# Patient Record
Sex: Male | Born: 1970 | Race: White | Hispanic: No | Marital: Single | Smoking: Never smoker
Health system: Southern US, Community
[De-identification: ages and names within clinical notes are randomized; demographics above are authoritative.]

## PROBLEM LIST (undated history)

## (undated) DIAGNOSIS — K219 Gastro-esophageal reflux disease without esophagitis: Secondary | ICD-10-CM

## (undated) DIAGNOSIS — I10 Essential (primary) hypertension: Secondary | ICD-10-CM

## (undated) DIAGNOSIS — R7302 Impaired glucose tolerance (oral): Secondary | ICD-10-CM

## (undated) DIAGNOSIS — N200 Calculus of kidney: Secondary | ICD-10-CM

## (undated) DIAGNOSIS — E669 Obesity, unspecified: Secondary | ICD-10-CM

## (undated) DIAGNOSIS — E785 Hyperlipidemia, unspecified: Secondary | ICD-10-CM

## (undated) DIAGNOSIS — E119 Type 2 diabetes mellitus without complications: Secondary | ICD-10-CM

## (undated) DIAGNOSIS — F419 Anxiety disorder, unspecified: Secondary | ICD-10-CM

## (undated) DIAGNOSIS — G4730 Sleep apnea, unspecified: Secondary | ICD-10-CM

## (undated) HISTORY — PX: HAND SURGERY: SHX662

## (undated) HISTORY — DX: Sleep apnea, unspecified: G47.30

## (undated) HISTORY — DX: Gastro-esophageal reflux disease without esophagitis: K21.9

## (undated) HISTORY — DX: Anxiety disorder, unspecified: F41.9

## (undated) HISTORY — PX: OTHER SURGICAL HISTORY: SHX169

## (undated) HISTORY — DX: Calculus of kidney: N20.0

## (undated) HISTORY — DX: Type 2 diabetes mellitus without complications: E11.9

## (undated) HISTORY — DX: Impaired glucose tolerance (oral): R73.02

## (undated) HISTORY — DX: Hyperlipidemia, unspecified: E78.5

## (undated) HISTORY — DX: Obesity, unspecified: E66.9

---

## 2002-09-24 ENCOUNTER — Emergency Department (HOSPITAL_COMMUNITY): Admission: EM | Admit: 2002-09-24 | Discharge: 2002-09-24 | Payer: Self-pay | Admitting: Emergency Medicine

## 2004-07-28 ENCOUNTER — Emergency Department (HOSPITAL_COMMUNITY): Admission: EM | Admit: 2004-07-28 | Discharge: 2004-07-28 | Payer: Self-pay | Admitting: Family Medicine

## 2004-08-04 ENCOUNTER — Emergency Department (HOSPITAL_COMMUNITY): Admission: EM | Admit: 2004-08-04 | Discharge: 2004-08-04 | Payer: Self-pay | Admitting: Emergency Medicine

## 2004-10-10 ENCOUNTER — Emergency Department (HOSPITAL_COMMUNITY): Admission: EM | Admit: 2004-10-10 | Discharge: 2004-10-10 | Payer: Self-pay | Admitting: Family Medicine

## 2005-10-29 ENCOUNTER — Emergency Department (HOSPITAL_COMMUNITY): Admission: EM | Admit: 2005-10-29 | Discharge: 2005-10-29 | Payer: Self-pay | Admitting: Emergency Medicine

## 2006-06-07 ENCOUNTER — Emergency Department (HOSPITAL_COMMUNITY): Admission: EM | Admit: 2006-06-07 | Discharge: 2006-06-07 | Payer: Self-pay | Admitting: Emergency Medicine

## 2006-11-22 ENCOUNTER — Emergency Department (HOSPITAL_COMMUNITY): Admission: EM | Admit: 2006-11-22 | Discharge: 2006-11-22 | Payer: Self-pay | Admitting: Emergency Medicine

## 2011-04-06 ENCOUNTER — Emergency Department (HOSPITAL_BASED_OUTPATIENT_CLINIC_OR_DEPARTMENT_OTHER)
Admission: EM | Admit: 2011-04-06 | Discharge: 2011-04-06 | Disposition: A | Discharge status: Home / Self Care | Payer: No Typology Code available for payment source | Attending: Emergency Medicine | Admitting: Emergency Medicine

## 2011-04-06 ENCOUNTER — Encounter: Payer: Self-pay | Admitting: Emergency Medicine

## 2011-04-06 DIAGNOSIS — E669 Obesity, unspecified: Secondary | ICD-10-CM | POA: Insufficient documentation

## 2011-04-06 DIAGNOSIS — T148XXA Other injury of unspecified body region, initial encounter: Secondary | ICD-10-CM

## 2011-04-06 DIAGNOSIS — S0003XA Contusion of scalp, initial encounter: Secondary | ICD-10-CM | POA: Insufficient documentation

## 2011-04-06 DIAGNOSIS — S1093XA Contusion of unspecified part of neck, initial encounter: Secondary | ICD-10-CM | POA: Insufficient documentation

## 2011-04-06 DIAGNOSIS — S0083XA Contusion of other part of head, initial encounter: Secondary | ICD-10-CM

## 2011-04-06 DIAGNOSIS — Y92410 Unspecified street and highway as the place of occurrence of the external cause: Secondary | ICD-10-CM | POA: Insufficient documentation

## 2011-04-06 MED ORDER — ONDANSETRON HCL 8 MG PO TABS: 4.0000 mg | ORAL_TABLET | Freq: Once | ORAL | Status: DC

## 2011-04-06 MED ORDER — ONDANSETRON 4 MG PO TBDP
4.0000 mg | ORAL_TABLET | Freq: Once | ORAL | Status: AC
  Administered 2011-04-06: 4 mg via ORAL

## 2011-04-06 MED ORDER — IBUPROFEN 800 MG PO TABS
800.0000 mg | ORAL_TABLET | Freq: Once | ORAL | Status: AC
  Administered 2011-04-06: 800 mg via ORAL
  Filled 2011-04-06: qty 1

## 2011-04-06 MED ORDER — CYCLOBENZAPRINE HCL 10 MG PO TABS: 10.0000 mg | ORAL_TABLET | Freq: Two times a day (BID) | ORAL | Status: AC | PRN

## 2011-04-06 MED ORDER — OXYCODONE-ACETAMINOPHEN 5-325 MG PO TABS
2.0000 | ORAL_TABLET | Freq: Once | ORAL | Status: AC
  Administered 2011-04-06: 2 via ORAL
  Filled 2011-04-06: qty 2

## 2011-04-06 MED ORDER — ONDANSETRON 4 MG PO TBDP
ORAL_TABLET | ORAL | Status: AC
  Administered 2011-04-06: 4 mg via ORAL
  Filled 2011-04-06: qty 1

## 2011-04-06 MED ORDER — NAPROXEN 375 MG PO TABS: 375.0000 mg | ORAL_TABLET | Freq: Two times a day (BID) | ORAL | Status: AC

## 2011-04-06 MED ORDER — DIAZEPAM 5 MG PO TABS
5.0000 mg | ORAL_TABLET | Freq: Once | ORAL | Status: AC
  Administered 2011-04-06: 5 mg via ORAL
  Filled 2011-04-06: qty 1

## 2011-04-06 NOTE — ED Notes (Signed)
MD at bedside. 
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
## 2012-10-02 ENCOUNTER — Encounter (HOSPITAL_COMMUNITY): Payer: Self-pay | Admitting: Emergency Medicine

## 2012-10-02 ENCOUNTER — Emergency Department (HOSPITAL_COMMUNITY)
Admission: EM | Admit: 2012-10-02 | Discharge: 2012-10-02 | Disposition: A | Discharge status: Home / Self Care | Payer: No Typology Code available for payment source | Attending: Emergency Medicine | Admitting: Emergency Medicine

## 2012-10-02 DIAGNOSIS — I10 Essential (primary) hypertension: Secondary | ICD-10-CM

## 2012-10-02 DIAGNOSIS — S4980XA Other specified injuries of shoulder and upper arm, unspecified arm, initial encounter: Secondary | ICD-10-CM | POA: Insufficient documentation

## 2012-10-02 DIAGNOSIS — Z79899 Other long term (current) drug therapy: Secondary | ICD-10-CM | POA: Insufficient documentation

## 2012-10-02 DIAGNOSIS — S46909A Unspecified injury of unspecified muscle, fascia and tendon at shoulder and upper arm level, unspecified arm, initial encounter: Secondary | ICD-10-CM | POA: Insufficient documentation

## 2012-10-02 DIAGNOSIS — S0993XA Unspecified injury of face, initial encounter: Secondary | ICD-10-CM | POA: Insufficient documentation

## 2012-10-02 DIAGNOSIS — Y92410 Unspecified street and highway as the place of occurrence of the external cause: Secondary | ICD-10-CM | POA: Insufficient documentation

## 2012-10-02 DIAGNOSIS — Y9389 Activity, other specified: Secondary | ICD-10-CM | POA: Insufficient documentation

## 2012-10-02 DIAGNOSIS — S199XXA Unspecified injury of neck, initial encounter: Secondary | ICD-10-CM | POA: Insufficient documentation

## 2012-10-02 MED ORDER — NAPROXEN 500 MG PO TABS: ORAL_TABLET | ORAL | Status: DC

## 2012-10-02 MED ORDER — CYCLOBENZAPRINE HCL 10 MG PO TABS: ORAL_TABLET | ORAL | Status: DC

## 2012-10-02 MED ORDER — IBUPROFEN 800 MG PO TABS
800.0000 mg | ORAL_TABLET | Freq: Once | ORAL | Status: AC
  Administered 2012-10-02: 800 mg via ORAL
  Filled 2012-10-02: qty 1
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
## 2012-10-03 NOTE — ED Provider Notes (Signed)
Medical screening examination/treatment/procedure(s) were performed by non-physician practitioner and as supervising physician I was immediately available for consultation/collaboration.   Aylin Rhoads J. Kadence Mimbs, MD 10/03/12 0019 

## 2014-01-31 ENCOUNTER — Emergency Department (HOSPITAL_COMMUNITY)
Admission: EM | Admit: 2014-01-31 | Discharge: 2014-01-31 | Disposition: A | Discharge status: Home / Self Care | Payer: 59 | Attending: Emergency Medicine | Admitting: Emergency Medicine

## 2014-01-31 ENCOUNTER — Emergency Department (HOSPITAL_COMMUNITY): Payer: 59

## 2014-01-31 ENCOUNTER — Encounter (HOSPITAL_COMMUNITY): Payer: Self-pay | Admitting: Emergency Medicine

## 2014-01-31 DIAGNOSIS — R1013 Epigastric pain: Secondary | ICD-10-CM | POA: Diagnosis present

## 2014-01-31 DIAGNOSIS — K859 Acute pancreatitis without necrosis or infection, unspecified: Secondary | ICD-10-CM | POA: Insufficient documentation

## 2014-01-31 DIAGNOSIS — R0602 Shortness of breath: Secondary | ICD-10-CM | POA: Diagnosis not present

## 2014-01-31 DIAGNOSIS — E669 Obesity, unspecified: Secondary | ICD-10-CM | POA: Insufficient documentation

## 2014-01-31 DIAGNOSIS — Z79899 Other long term (current) drug therapy: Secondary | ICD-10-CM | POA: Insufficient documentation

## 2014-01-31 DIAGNOSIS — M549 Dorsalgia, unspecified: Secondary | ICD-10-CM | POA: Insufficient documentation

## 2014-01-31 LAB — CBC WITH DIFFERENTIAL/PLATELET
BASOS ABS: 0.1 10*3/uL (ref 0.0–0.1)
Basophils Relative: 0 % (ref 0–1)
EOS PCT: 2 % (ref 0–5)
Eosinophils Absolute: 0.3 10*3/uL (ref 0.0–0.7)
HEMATOCRIT: 42.7 % (ref 39.0–52.0)
Hemoglobin: 14.2 g/dL (ref 13.0–17.0)
LYMPHS ABS: 2.5 10*3/uL (ref 0.7–4.0)
LYMPHS PCT: 17 % (ref 12–46)
MCH: 30 pg (ref 26.0–34.0)
MCHC: 33.3 g/dL (ref 30.0–36.0)
MCV: 90.1 fL (ref 78.0–100.0)
MONO ABS: 1 10*3/uL (ref 0.1–1.0)
Monocytes Relative: 7 % (ref 3–12)
NEUTROS ABS: 10.7 10*3/uL — AB (ref 1.7–7.7)
Neutrophils Relative %: 74 % (ref 43–77)
Platelets: 265 10*3/uL (ref 150–400)
RBC: 4.74 MIL/uL (ref 4.22–5.81)
RDW: 13.7 % (ref 11.5–15.5)
WBC: 14.5 10*3/uL — AB (ref 4.0–10.5)

## 2014-01-31 LAB — LIPASE, BLOOD: Lipase: 124 U/L — ABNORMAL HIGH (ref 11–59)

## 2014-01-31 LAB — COMPREHENSIVE METABOLIC PANEL
ALT: 13 U/L (ref 0–53)
AST: 14 U/L (ref 0–37)
Albumin: 3.6 g/dL (ref 3.5–5.2)
Alkaline Phosphatase: 60 U/L (ref 39–117)
Anion gap: 14 (ref 5–15)
BILIRUBIN TOTAL: 0.4 mg/dL (ref 0.3–1.2)
BUN: 13 mg/dL (ref 6–23)
CALCIUM: 9.2 mg/dL (ref 8.4–10.5)
CHLORIDE: 100 meq/L (ref 96–112)
CO2: 25 meq/L (ref 19–32)
CREATININE: 0.79 mg/dL (ref 0.50–1.35)
GLUCOSE: 174 mg/dL — AB (ref 70–99)
Potassium: 4.6 mEq/L (ref 3.7–5.3)
SODIUM: 139 meq/L (ref 137–147)
Total Protein: 7.7 g/dL (ref 6.0–8.3)

## 2014-01-31 LAB — TROPONIN I

## 2014-01-31 MED ORDER — IOHEXOL 300 MG/ML  SOLN
50.0000 mL | Freq: Once | INTRAMUSCULAR | Status: AC | PRN
  Administered 2014-01-31: 50 mL via ORAL

## 2014-01-31 MED ORDER — ONDANSETRON 8 MG PO TBDP: 8.0000 mg | ORAL_TABLET | Freq: Three times a day (TID) | ORAL | Status: DC | PRN

## 2014-01-31 MED ORDER — GI COCKTAIL ~~LOC~~
30.0000 mL | Freq: Once | ORAL | Status: AC
  Administered 2014-01-31: 30 mL via ORAL
  Filled 2014-01-31: qty 30

## 2014-01-31 MED ORDER — MORPHINE SULFATE 4 MG/ML IJ SOLN
4.0000 mg | Freq: Once | INTRAMUSCULAR | Status: AC
  Administered 2014-01-31: 4 mg via INTRAVENOUS
  Filled 2014-01-31: qty 1

## 2014-01-31 MED ORDER — HYDROCODONE-ACETAMINOPHEN 5-325 MG PO TABS: 1.0000 | ORAL_TABLET | Freq: Four times a day (QID) | ORAL | Status: DC | PRN

## 2014-01-31 MED ORDER — IOHEXOL 300 MG/ML  SOLN
100.0000 mL | Freq: Once | INTRAMUSCULAR | Status: AC | PRN
  Administered 2014-01-31: 100 mL via INTRAVENOUS

## 2014-01-31 MED ORDER — SODIUM CHLORIDE 0.9 % IV BOLUS (SEPSIS)
1000.0000 mL | Freq: Once | INTRAVENOUS | Status: AC
  Administered 2014-01-31: 1000 mL via INTRAVENOUS

## 2014-01-31 MED ORDER — ONDANSETRON HCL 4 MG/2ML IJ SOLN
4.0000 mg | Freq: Once | INTRAMUSCULAR | Status: AC
  Administered 2014-01-31: 4 mg via INTRAVENOUS
  Filled 2014-01-31: qty 2

## 2014-01-31 MED ORDER — FAMOTIDINE 20 MG PO TABS
20.0000 mg | ORAL_TABLET | Freq: Once | ORAL | Status: AC
  Administered 2014-01-31: 20 mg via ORAL
  Filled 2014-01-31: qty 1

## 2014-01-31 NOTE — ED Provider Notes (Signed)
Signed out by Dr Dina Rich at 0800 that pt w suspected mild pancreatitis, ct pending, if ct c/w pancreatitis, d/c to home.  Recheck pt, feels much improved. Pain controlled. No nv. Tolerating po fluids.   Discussed ct results w pt, including pancreatitis, and incidental note of liver/renal lesions and need for follow up imaging.   Pt currently appears stable for d/c.     Mirna Mires, MD 01/31/14 [phone]

## 2014-01-31 NOTE — ED Notes (Signed)
Pt escorted to discharge window. Pt verbalized understanding discharge instructions. In no acute distress.  

## 2014-01-31 NOTE — ED Notes (Signed)
Patient transported to CT 

## 2014-01-31 NOTE — ED Notes (Signed)
Patient c/o upper abd pain, with pain to back and difficulty breathing. Patient states symptoms started yesterday at work. 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 Patient is able to speak in complete sentences without difficulty. Patient describes pain as a sensation of fullness.

## 2014-01-31 NOTE — ED Provider Notes (Signed)
CSN: [id number]     Arrival date & time 01/31/14  0436 History   First MD Initiated Contact with Patient 01/31/14 0450     Chief Complaint  Patient presents with  . Abdominal Pain  . Back Pain     (Consider location/radiation/quality/duration/timing/severity/associated sxs/prior Treatment) HPI  This a 43 year old male who presents with epigastric pain. Patient reports onset of pain yesterday. He reports the sharp and radiates to the back. He states that it takes his breath away. It is not associated with food. Patient states that when he "passes gas" it may get a little bit better. Currently his pain is 7/10. He states that he feels very full. He's never had anything on this in the past. He denies any nausea, vomiting, diarrhea. Patient denies any chest pain. He does report shortness of breath. No fevers or cough.  History reviewed. No pertinent past medical history. Past Surgical History  Procedure Laterality Date  . Hand surgery     No family history on file. History  Substance Use Topics  . Smoking status: Never Smoker   . Smokeless tobacco: Not on file  . Alcohol Use: Yes     Comment: rarely    Review of Systems  Constitutional: Negative.  Negative for fever.  Respiratory: Positive for shortness of breath. Negative for cough and chest tightness.   Cardiovascular: Negative.  Negative for chest pain and leg swelling.  Gastrointestinal: Positive for abdominal pain. Negative for nausea, vomiting and diarrhea.  Genitourinary: Negative.  Negative for dysuria.  Musculoskeletal: Negative for back pain.  Skin: Negative for rash.  Neurological: Negative for headaches.  All other systems reviewed and are negative.     Allergies  Review of patient's allergies indicates no known allergies.  Home Medications   Prior to Admission medications   Medication Sig Start Date End Date Taking? Authorizing Provider  acetaminophen (TYLENOL) 325 MG tablet Take 650 mg by mouth every 6  (six) hours as needed for mild pain.   Yes Historical Provider, MD  ibuprofen (ADVIL,MOTRIN) 200 MG tablet Take 600 mg by mouth every 6 (six) hours as needed for moderate pain.   Yes Historical Provider, MD   BP 151/98  Temp(Src) 98 F (36.7 C) (Oral)  Resp 18  Ht 5\' 7"  (1.702 m)  Wt 290 lb (131.543 kg)  BMI 45.41 kg/m2  SpO2 100% Physical Exam  Nursing note and vitals reviewed. Constitutional: He is oriented to person, place, and time. He appears well-developed and well-nourished.  Obese  HENT:  Head: Normocephalic and atraumatic.  Mouth/Throat: Oropharynx is clear and moist.  Eyes: Pupils are equal, round, and reactive to light.  Cardiovascular: Normal rate, regular rhythm and normal heart sounds.   No murmur heard. Pulmonary/Chest: Effort normal and breath sounds normal. No respiratory distress. He has no wheezes.  Abdominal: Soft. Bowel sounds are normal. There is tenderness. There is no rebound.  Tenderness to palpation over the epigastrium without rebound or guarding  Musculoskeletal: He exhibits no edema.  Neurological: He is alert and oriented to person, place, and time.  Skin: Skin is warm and dry.  Psychiatric: He has a normal mood and affect.    ED Course  Procedures (including critical care time) Labs Review Labs Reviewed  CBC WITH DIFFERENTIAL - Abnormal; Notable for the following:    WBC 14.5 (*)    Neutro Abs 10.7 (*)    All other components within normal limits  COMPREHENSIVE METABOLIC PANEL - Abnormal; Notable for the following:  Glucose, Bld 174 (*)    All other components within normal limits  LIPASE, BLOOD - Abnormal; Notable for the following:    Lipase 124 (*)    All other components within normal limits  TROPONIN I    Imaging Review Dg Chest 2 View  01/31/2014   CLINICAL DATA:  Upper abdominal pain and back pain. Difficulty breathing.  EXAM: CHEST  2 VIEW  COMPARISON:  None.  FINDINGS: The lungs are hypoexpanded but appear grossly clear. There  is no evidence of focal opacification, pleural effusion or pneumothorax.  The heart is normal in size; the mediastinal contour is within normal limits. No acute osseous abnormalities are seen.  IMPRESSION: Lungs hypoexpanded but grossly clear.   Electronically Signed   By: Garald Balding M.D.   On: 01/31/2014 06:39     EKG Interpretation   Date/Time:  Sunday January 31 2014 04:49:52 EDT Ventricular Rate:  98 PR Interval:  123 QRS Duration: 104 QT Interval:  341 QTC Calculation: 435 R Axis:   85 Text Interpretation:  Sinus rhythm No prior for comparison Confirmed by  HORTON  MD, COURTNEY ([id number]) on 01/31/2014 5:28:31 AM      MDM   Final diagnoses:  None    Patient presents with abdominal pain. Nontoxic on exam. Vital signs are reassuring. Tenderness to palpation to the epigastrium. History suggestive of GERD versus pancreatitis. Denies any alcohol use. Other considerations also include ACS. Screening EKG reassuring and troponin negative. Patient given GI cocktail as well as Pepcid with only minimal improvement of his pain. Labwork notable for mildly elevated lipase at 124 and leukocytosis to 14. Patient continues to be tender on exam. Will give additional pain medication and obtain CT scan.    Merryl Hacker, MD 01/31/14 [phone]

## 2014-01-31 NOTE — Discharge Instructions (Signed)
Rest. Drink plenty of fluids. Clear liquid diet for the next day, then advance as tolerated. You may take hydrocodone as need for pain. No driving when taking hydrocodone. Also, do not take tylenol or acetaminophen containing medication when taking hydrocodone. You may take zofran as need for nausea.   Your ct scan was read as follows:  IMPRESSION: Mild pancreatitis. No complicating features identified.  1.2 x 1.5 cm indeterminate right hepatic lesion and too small to characterize left renal lesions. These are statistically benign but recommend MRI with and without contrast for further evaluation. Non-emergent MRI should be deferred until patient has been discharged for the acute illness, and can optimally cooperate with positioning and breath-holding instructions.  Punctate nonobstructing right renal calculi.    It is the pancreatitis that is responsible for today's pain.  For incidental note of small liver/kidney lesions, follow up with primary care doctor for outpatient imaging/MR scan in the next couple months - discuss above findings with primary care doctor and have them arrange the appropriate, recommended, follow up imaging.   You were given pain medication in the ER - no driving for the next 4 hours.     Acute Pancreatitis Acute pancreatitis is a disease in which the pancreas becomes suddenly inflamed. The pancreas is a large gland located behind your stomach. The pancreas produces enzymes that help digest food. The pancreas also releases the hormones glucagon and insulin that help regulate blood sugar. Damage to the pancreas occurs when the digestive enzymes from the pancreas are activated and begin attacking the pancreas before being released into the intestine. Most acute attacks last a couple of days and can cause serious complications. Some people become dehydrated and develop low blood pressure. In severe cases, bleeding into the pancreas can lead to shock and can be  life-threatening. The lungs, heart, and kidneys may fail. CAUSES  Pancreatitis can happen to anyone. In some cases, the cause is unknown. Most cases are caused by:  Alcohol abuse.  Gallstones. Other less common causes are:  Certain medicines.  Exposure to certain chemicals.  Infection.  Damage caused by an accident (trauma).  Abdominal surgery. SYMPTOMS   Pain in the upper abdomen that may radiate to the back.  Tenderness and swelling of the abdomen.  Nausea and vomiting. DIAGNOSIS  Your caregiver will perform a physical exam. Blood and stool tests may be done to confirm the diagnosis. Imaging tests may also be done, such as X-rays, CT scans, or an ultrasound of the abdomen. TREATMENT  Treatment usually requires a stay in the hospital. Treatment may include:  Pain medicine.  Fluid replacement through an intravenous line (IV).  Placing a tube in the stomach to remove stomach contents and control vomiting.  Not eating for 3 or 4 days. This gives your pancreas a rest, because enzymes are not being produced that can cause further damage.  Antibiotic medicines if your condition is caused by an infection.  Surgery of the pancreas or gallbladder. HOME CARE INSTRUCTIONS   Follow the diet advised by your caregiver. This may involve avoiding alcohol and decreasing the amount of fat in your diet.  Eat smaller, more frequent meals. This reduces the amount of digestive juices the pancreas produces.  Drink enough fluids to keep your urine clear or pale yellow.  Only take over-the-counter or prescription medicines as directed by your caregiver.  Avoid drinking alcohol if it caused your condition.  Do not smoke.  Get plenty of rest.  Check your blood sugar at  home as directed by your caregiver.  Keep all follow-up appointments as directed by your caregiver. SEEK MEDICAL CARE IF:   You do not recover as quickly as expected.  You develop new or worsening symptoms.  You  have persistent pain, weakness, or nausea.  You recover and then have another episode of pain. SEEK IMMEDIATE MEDICAL CARE IF:   You are unable to eat or keep fluids down.  Your pain becomes severe.  You have a fever or persistent symptoms for more than 2 to 3 days.  You have a fever and your symptoms suddenly get worse.  Your skin or the white part of your eyes turn yellow (jaundice).  You develop vomiting.  You feel dizzy, or you faint.  Your blood sugar is high (over 300 mg/dL). MAKE SURE YOU:   Understand these instructions.  Will watch your condition.  Will get help right away if you are not doing well or get worse. Document Released: 05/07/2005 Document Revised: 11/06/2011 Document Reviewed: 08/16/2011 [facility] Patient Information 2015 [place], [place]. This information is not intended to replace advice given to you by your health care provider. Make sure you discuss any questions you have with your health care provider.

## 2014-01-31 NOTE — ED Notes (Addendum)
Pt to CT  Pt alert and oriented x4. Respirations even and unlabored, bilateral symmetrical rise and fall of chest. Skin warm and dry. In no acute distress. Denies needs.

## 2014-02-03 ENCOUNTER — Other Ambulatory Visit: Payer: Self-pay | Admitting: Gastroenterology

## 2014-02-03 DIAGNOSIS — K858 Other acute pancreatitis without necrosis or infection: Secondary | ICD-10-CM

## 2014-02-12 ENCOUNTER — Ambulatory Visit
Admission: RE | Admit: 2014-02-12 | Discharge: 2014-02-12 | Disposition: A | Discharge status: Home / Self Care | Payer: 59 | Source: Ambulatory Visit | Attending: Gastroenterology | Admitting: Gastroenterology

## 2014-02-12 DIAGNOSIS — K858 Other acute pancreatitis without necrosis or infection: Secondary | ICD-10-CM

## 2014-03-27 ENCOUNTER — Ambulatory Visit (INDEPENDENT_AMBULATORY_CARE_PROVIDER_SITE_OTHER): Payer: 59 | Admitting: Emergency Medicine

## 2014-03-27 VITALS — BP 130/90 | HR 85 | Temp 98.9°F | Resp 18 | Ht 68.25 in | Wt 296.0 lb

## 2014-03-27 DIAGNOSIS — J0100 Acute maxillary sinusitis, unspecified: Secondary | ICD-10-CM

## 2014-03-27 DIAGNOSIS — J209 Acute bronchitis, unspecified: Secondary | ICD-10-CM

## 2014-03-27 MED ORDER — PROMETHAZINE-CODEINE 6.25-10 MG/5ML PO SYRP: 5.0000 mL | ORAL_SOLUTION | Freq: Four times a day (QID) | ORAL | Status: DC | PRN

## 2014-03-27 MED ORDER — PSEUDOEPHEDRINE-GUAIFENESIN ER 60-600 MG PO TB12: 1.0000 | ORAL_TABLET | Freq: Two times a day (BID) | ORAL | Status: DC

## 2014-03-27 MED ORDER — AMOXICILLIN-POT CLAVULANATE 875-125 MG PO TABS: 1.0000 | ORAL_TABLET | Freq: Two times a day (BID) | ORAL | Status: DC

## 2014-03-27 NOTE — Progress Notes (Signed)
[facility] [address], [place] [address] [phone]  Date:  03/27/2014   Name:  Benjamin Sharp   DOB:  04-Mar-1971   MRN:  [id number]  PCP:  Geoffery Lyons, MD    Chief Complaint: Cough; Headache; and Fatigue   History of Present Illness:  Benjamin Sharp is a 43 y.o. very pleasant male patient who presents with the following:  Ill for a week with nasal congestion and purulent nasal discharge and post nasal drip. Sore throat Cough productive of purulent sputum.  Some exertional shortness of breath.  No wheezing No nausea or vomiting.  No stool change or rash. No fever or chills. No improvement with over the counter medications or other home remedies.  Denies other complaint or health concern today. Moderate malaise and aches   There are no active problems to display for this patient.   History reviewed. No pertinent past medical history.  Past Surgical History  Procedure Laterality Date  . Hand surgery      History  Substance Use Topics  . Smoking status: Never Smoker   . Smokeless tobacco: Not on file  . Alcohol Use: Yes     Comment: rarely    Family History  Problem Relation Age of Onset  . Diabetes Mother   . Diabetes Father     No Known Allergies  Medication list has been reviewed and updated.  Current Outpatient Prescriptions on File Prior to Visit  Medication Sig Dispense Refill  . acetaminophen (TYLENOL) 325 MG tablet Take 650 mg by mouth every 6 (six) hours as needed for mild pain.    Marland Kitchen ibuprofen (ADVIL,MOTRIN) 200 MG tablet Take 600 mg by mouth every 6 (six) hours as needed for moderate pain.    Marland Kitchen HYDROcodone-acetaminophen (NORCO/VICODIN) 5-325 MG per tablet Take 1-2 tablets by mouth every 6 (six) hours as needed for moderate pain. 20 tablet 0  . ondansetron (ZOFRAN ODT) 8 MG disintegrating tablet Take 1 tablet (8 mg total) by mouth every 8 (eight) hours as needed for nausea or vomiting. 10 tablet 0   No current  facility-administered medications on file prior to visit.    Review of Systems:  As per HPI, otherwise negative.    Physical Examination: Filed Vitals:   03/27/14 0905  BP: 130/90  Pulse: 85  Temp: 98.9 F (37.2 C)  Resp: 18   Filed Vitals:   03/27/14 0905  Height: 5' 8.25" (1.734 m)  Weight: 296 lb (134.265 kg)   Body mass index is 44.65 kg/(m^2). Ideal Body Weight: Weight in (lb) to have BMI = 25: 165.3  GEN: obese, NAD, Non-toxic, A & O x 3 HEENT: Atraumatic, Normocephalic. Neck supple. No masses, No LAD. Ears and Nose: No external deformity. CV: RRR, No M/G/R. No JVD. No thrill. No extra heart sounds. PULM: CTA B, no wheezes, crackles, rhonchi. No retractions. No resp. distress. No accessory muscle use. ABD: S, NT, ND, +BS. No rebound. No HSM. EXTR: No c/c/e NEURO Normal gait.  PSYCH: Normally interactive. Conversant. Not depressed or anxious appearing.  Calm demeanor.    Assessment and Plan: Sinusitis Bronchitis augmentin mucinex d prometh c cod  Signed,  Ellison Carwin, MD

## 2014-03-27 NOTE — Patient Instructions (Signed)

## 2014-04-15 ENCOUNTER — Emergency Department (HOSPITAL_COMMUNITY): Payer: 59

## 2014-04-15 ENCOUNTER — Emergency Department (HOSPITAL_COMMUNITY)
Admission: EM | Admit: 2014-04-15 | Discharge: 2014-04-15 | Disposition: A | Discharge status: Home / Self Care | Payer: 59 | Attending: Emergency Medicine | Admitting: Emergency Medicine

## 2014-04-15 ENCOUNTER — Encounter (HOSPITAL_COMMUNITY): Payer: Self-pay | Admitting: *Deleted

## 2014-04-15 DIAGNOSIS — N390 Urinary tract infection, site not specified: Secondary | ICD-10-CM | POA: Insufficient documentation

## 2014-04-15 DIAGNOSIS — R319 Hematuria, unspecified: Secondary | ICD-10-CM

## 2014-04-15 DIAGNOSIS — R109 Unspecified abdominal pain: Secondary | ICD-10-CM

## 2014-04-15 DIAGNOSIS — I10 Essential (primary) hypertension: Secondary | ICD-10-CM | POA: Diagnosis not present

## 2014-04-15 DIAGNOSIS — Z79899 Other long term (current) drug therapy: Secondary | ICD-10-CM | POA: Diagnosis not present

## 2014-04-15 DIAGNOSIS — R000 Tachycardia, unspecified: Secondary | ICD-10-CM | POA: Diagnosis not present

## 2014-04-15 HISTORY — DX: Essential (primary) hypertension: I10

## 2014-04-15 LAB — BASIC METABOLIC PANEL
Anion gap: 16 — ABNORMAL HIGH (ref 5–15)
BUN: 14 mg/dL (ref 6–23)
CO2: 24 meq/L (ref 19–32)
CREATININE: 0.86 mg/dL (ref 0.50–1.35)
Calcium: 8.9 mg/dL (ref 8.4–10.5)
Chloride: 94 mEq/L — ABNORMAL LOW (ref 96–112)
GFR calc Af Amer: >90 mL/min (ref >90)
GFR calc non Af Amer: >90 mL/min (ref >90)
Glucose, Bld: 294 mg/dL — ABNORMAL HIGH (ref 70–99)
Potassium: 4.2 mEq/L (ref 3.7–5.3)
Sodium: 134 mEq/L — ABNORMAL LOW (ref 137–147)

## 2014-04-15 LAB — CBC WITH DIFFERENTIAL/PLATELET
BASOS ABS: 0 10*3/uL (ref 0.0–0.1)
BASOS PCT: 0 % (ref 0–1)
EOS PCT: 2 % (ref 0–5)
Eosinophils Absolute: 0.2 10*3/uL (ref 0.0–0.7)
HCT: 40.4 % (ref 39.0–52.0)
Hemoglobin: 13.3 g/dL (ref 13.0–17.0)
LYMPHS PCT: 19 % (ref 12–46)
Lymphs Abs: 2.8 10*3/uL (ref 0.7–4.0)
MCH: 29.1 pg (ref 26.0–34.0)
MCHC: 32.9 g/dL (ref 30.0–36.0)
MCV: 88.4 fL (ref 78.0–100.0)
MONO ABS: 0.9 10*3/uL (ref 0.1–1.0)
Monocytes Relative: 7 % (ref 3–12)
Neutro Abs: 10.3 10*3/uL — ABNORMAL HIGH (ref 1.7–7.7)
Neutrophils Relative %: 72 % (ref 43–77)
Platelets: 291 10*3/uL (ref 150–400)
RBC: 4.57 MIL/uL (ref 4.22–5.81)
RDW: 13.8 % (ref 11.5–15.5)
WBC: 14.2 10*3/uL — ABNORMAL HIGH (ref 4.0–10.5)

## 2014-04-15 LAB — URINALYSIS, ROUTINE W REFLEX MICROSCOPIC
BILIRUBIN URINE: NEGATIVE
Ketones, ur: NEGATIVE mg/dL
Nitrite: NEGATIVE
PH: 5.5 (ref 5.0–8.0)
Protein, ur: 30 mg/dL — AB
SPECIFIC GRAVITY, URINE: 1.033 — AB (ref 1.005–1.030)
Urobilinogen, UA: 0.2 mg/dL (ref 0.0–1.0)

## 2014-04-15 LAB — URINE MICROSCOPIC-ADD ON

## 2014-04-15 MED ORDER — SODIUM CHLORIDE 0.9 % IV BOLUS (SEPSIS)
1000.0000 mL | Freq: Once | INTRAVENOUS | Status: AC
  Administered 2014-04-15: 1000 mL via INTRAVENOUS

## 2014-04-15 MED ORDER — DEXTROSE 5 % IV SOLN
1.0000 g | Freq: Once | INTRAVENOUS | Status: AC
  Administered 2014-04-15: 1 g via INTRAVENOUS
  Filled 2014-04-15: qty 10

## 2014-04-15 MED ORDER — CEPHALEXIN 500 MG PO CAPS: 500.0000 mg | ORAL_CAPSULE | Freq: Four times a day (QID) | ORAL | Status: DC

## 2014-04-15 NOTE — ED Notes (Signed)
The pt has had bright red blood since 1600 today.  The blood is bright red with clots.  No lpain.  He feels like he has to go more than he does..  No previous history

## 2014-04-15 NOTE — ED Provider Notes (Signed)
CSN: [id number]     Arrival date & time 04/15/14  1957 History   First MD Initiated Contact with Patient 04/15/14 2002     Chief Complaint  Patient presents with  . Hematuria     The history is provided by the patient.   Mr. Bomberger presents for evaluation of hematuria.  He developed some bright red blood in his urine starting at 4 PM today. He passed a small clot about the size of P at home. He reports urgency and frequency. He has mild discomfort with urination. He denies any abdominal pain but does have nausea. He had a small twinge in his right side that is now gone. Symptoms are moderate and intermittent and constant. He has no history of similar prior symptoms. He denies any history of trauma. He denies fevers, vomiting. He is not on any blood thinners.    Past Medical History  Diagnosis Date  . Hypertension    Past Surgical History  Procedure Laterality Date  . Hand surgery     Family History  Problem Relation Age of Onset  . Diabetes Mother   . Diabetes Father    History  Substance Use Topics  . Smoking status: Never Smoker   . Smokeless tobacco: Not on file  . Alcohol Use: Yes     Comment: rarely    Review of Systems  All other systems reviewed and are negative.     Allergies  Review of patient's allergies indicates no known allergies.  Home Medications   Prior to Admission medications   Medication Sig Start Date End Date Taking? Authorizing Provider  acetaminophen (TYLENOL) 325 MG tablet Take 650 mg by mouth every 6 (six) hours as needed for mild pain.    Historical Provider, MD  amoxicillin-clavulanate (AUGMENTIN) 875-125 MG per tablet Take 1 tablet by mouth 2 (two) times daily. 03/27/14   Roselee Culver, MD  HYDROcodone-acetaminophen (NORCO/VICODIN) 5-325 MG per tablet Take 1-2 tablets by mouth every 6 (six) hours as needed for moderate pain. 01/31/14   Mirna Mires, MD  ibuprofen (ADVIL,MOTRIN) 200 MG tablet Take 600 mg by mouth every 6 (six) hours as  needed for moderate pain.    Historical Provider, MD  ondansetron (ZOFRAN ODT) 8 MG disintegrating tablet Take 1 tablet (8 mg total) by mouth every 8 (eight) hours as needed for nausea or vomiting. 01/31/14   Mirna Mires, MD  promethazine-codeine [facility] WITH CODEINE) 6.25-10 MG/5ML syrup Take 5-10 mLs by mouth every 6 (six) hours as needed. 03/27/14   Roselee Culver, MD  pseudoephedrine-guaifenesin (MUCINEX D) 60-600 MG per tablet Take 1 tablet by mouth every 12 (twelve) hours. 03/27/14 03/27/15  Roselee Culver, MD   BP 156/101 mmHg  Pulse 120  Temp(Src) 99.2 F (37.3 C)  Resp 18  Ht 5\' 7"  (1.702 m)  Wt 285 lb (129.275 kg)  BMI 44.63 kg/m2  SpO2 98% Physical Exam  Constitutional: He is oriented to person, place, and time. He appears well-developed and well-nourished.  HENT:  Head: Normocephalic and atraumatic.  Cardiovascular:  No murmur heard. tachycardic  Pulmonary/Chest: Effort normal. No respiratory distress.  Abdominal: Soft. There is no tenderness. There is no rebound and no guarding.  Musculoskeletal: He exhibits no edema or tenderness.  Neurological: He is alert and oriented to person, place, and time.  Skin: Skin is warm and dry.  Psychiatric: He has a normal mood and affect. His behavior is normal.  Nursing note and vitals reviewed.  ED Course  Procedures (including critical care time) Labs Review Labs Reviewed  CBC WITH DIFFERENTIAL - Abnormal; Notable for the following:    WBC 14.2 (*)    Neutro Abs 10.3 (*)    All other components within normal limits  BASIC METABOLIC PANEL - Abnormal; Notable for the following:    Sodium 134 (*)    Chloride 94 (*)    Glucose, Bld 294 (*)    Anion gap 16 (*)    All other components within normal limits  URINALYSIS, ROUTINE W REFLEX MICROSCOPIC - Abnormal; Notable for the following:    Color, Urine AMBER (*)    APPearance CLOUDY (*)    Specific Gravity, Urine 1.033 (*)    Glucose, UA >1000 (*)    Hgb urine  dipstick LARGE (*)    Protein, ur 30 (*)    Leukocytes, UA SMALL (*)    All other components within normal limits  URINE MICROSCOPIC-ADD ON - Abnormal; Notable for the following:    Squamous Epithelial / LPF FEW (*)    Bacteria, UA FEW (*)    All other components within normal limits  URINE CULTURE    Imaging Review US Renal  04/15/2014   CLINICAL DATA:  Right flank pain.  EXAM: RENAL/URINARY TRACT ULTRASOUND COMPLETE  COMPARISON:  02/12/2014  FINDINGS: Right Kidney:  Length: 12 cm. Echogenicity within normal limits. No mass or hydronephrosis visualized.  Left Kidney:  Length: 12 cm. Echogenicity within normal limits. No mass or hydronephrosis visualized.  Bladder:  Appears normal for degree of bladder distention. Bilateral ureteral jets noted.  IMPRESSION: Negative renal ultrasound.   Electronically Signed   By: Jorje Guild M.D.   On: 04/15/2014 23:06     EKG Interpretation None      MDM   Final diagnoses:  Right flank pain  Hematuria  Acute UTI    Pt here with hematuria, has hx/o small nonobstructive stones on recent CT.  UA concerning for UTI. Renal US without evidence of obstruction and pt is nontoxic appearing.  D/w pt hematuria and UTI as well as need for Urology follow up for hematuria.  Discussed return precautions.  Pt given Rx for keflex.     Quintella Reichert, MD 04/15/14 2325

## 2014-04-15 NOTE — Discharge Instructions (Signed)

## 2014-04-15 NOTE — ED Notes (Signed)
Pt transported to radiology.

## 2014-04-17 LAB — URINE CULTURE

## 2014-04-19 ENCOUNTER — Telehealth (HOSPITAL_COMMUNITY): Payer: Self-pay

## 2014-04-19 NOTE — Telephone Encounter (Signed)
Post ED Visit - Positive Culture Follow-up  Culture report reviewed by antimicrobial stewardship pharmacist: []  Wes Odum, Pharm.D., BCPS []  Heide Guile, Pharm.D., BCPS []  Alycia Rossetti, Pharm.D., BCPS [x]  [place], [place].D., BCPS, AAHIVP []  Legrand Como, Pharm.D., BCPS, AAHIVP []  Elicia Lamp, Pharm.D.   Positive Urine culture Treated with Cephalexin, organism sensitive to the same and no further patient follow-up is required at this time.  Dortha Kern 04/19/2014, 6:30 AM

## 2014-05-21 HISTORY — PX: ELBOW SURGERY: SHX618

## 2015-01-04 ENCOUNTER — Encounter: Payer: Self-pay | Admitting: Neurology

## 2015-01-04 ENCOUNTER — Ambulatory Visit (INDEPENDENT_AMBULATORY_CARE_PROVIDER_SITE_OTHER): Payer: 59 | Admitting: Neurology

## 2015-01-04 VITALS — BP 126/88 | HR 78 | Resp 16 | Ht 68.25 in | Wt 291.0 lb

## 2015-01-04 DIAGNOSIS — G4762 Sleep related leg cramps: Secondary | ICD-10-CM

## 2015-01-04 DIAGNOSIS — G4733 Obstructive sleep apnea (adult) (pediatric): Secondary | ICD-10-CM

## 2015-01-04 DIAGNOSIS — G4719 Other hypersomnia: Secondary | ICD-10-CM

## 2015-01-04 NOTE — Patient Instructions (Signed)

## 2015-01-04 NOTE — Progress Notes (Signed)
Subjective:    Patient ID: Benjamin Sharp is a 44 y.o. male.  HPI     Star Age, MD, PhD [facility] [address] [address] [place],  [address]  Dear Dr. Reynaldo Minium,   I saw your patient, Benjamin Sharp, upon your kind request in my neurologic clinic today for initial consultation of his sleep disorder, in particular, concern for underlying obstructive sleep apnea. The patient is unaccompanied today. As you know, Benjamin Sharp is a 44 year old right-handed gentleman with an underlying medical history of hyperlipidemia, type 2 diabetes, glaucoma, asthma,  reflux disease, anxiety, hypertension, and severe obesity, who reports snoring and excessive daytime somnolence. He lives with his mother, sister and niece and all of them have reported his loud snoring and also told him that he has pauses in his breathing. He has woken himself up with loud snoring or snorting. He denies morning headaches. He has nighttime leg cramps that often wake him up. He denies frank restless leg symptoms. He sleeps alone and a queen size bed. He works full-time as a Counsellor. He used to work nights for about 9 years. In the past 7 or 8 years he has worked daytime hours and works from 6:30 AM to 6:30 PM. Bedtime varies but is generally around 10 PM and he falls asleep quickly. His rise time is 5 AM he needs does not wake up rested. He is tired during the day. His Epworth sleepiness score is 12 out of 24, his fatigue score is 47 out of 63. He is not aware of any family history of obstructive sleep apnea. He has mild asthma which is usually under good control. He has a history of acid reflux but symptoms have been under control with Protonix. He has been diagnosed with glaucoma and has eyedrops for this. He goes once a year for his eye checkup. He drinks alcohol occasionally, about 4 beers a month. He drinks sodas occasionally, about 4-6 sodas a month. He is a nonsmoker. He is divorced and has no  children.  His Past Medical History Is Significant For: Past Medical History  Diagnosis Date  . Hypertension   . Diabetes mellitus without complication   . Hyperlipemia   . Impaired glucose tolerance   . GERD (gastroesophageal reflux disease)   . Anxiety   . Sleep apnea   . Obesity     His Past Surgical History Is Significant For: Past Surgical History  Procedure Laterality Date  . Hand surgery    . Gall stones-2015      His Family History Is Significant For: Family History  Problem Relation Age of Onset  . Diabetes Mother   . Diabetes Father   . Cancer Father   . Diabetes Sister     His Social History Is Significant For: Social History   Social History  . Marital Status: Single    Spouse Name: N/A  . Number of Children: 0  . Years of Education: 12   Occupational History  . Dispatcher     Social History Main Topics  . Smoking status: Never Smoker   . Smokeless tobacco: None  . Alcohol Use: 0.0 oz/week    0 Standard drinks or equivalent per week     Comment: rarely  . Drug Use: No  . Sexual Activity: Not Asked   Other Topics Concern  . None   Social History Narrative   Drinks about 4-6 sodas a month    His Allergies Are:  No  Known Allergies:   His Current Medications Are:  Outpatient Encounter Prescriptions as of 01/04/2015  Medication Sig  . metFORMIN (GLUCOPHAGE) 1000 MG tablet   . pantoprazole (PROTONIX) 40 MG tablet   . trimethoprim (TRIMPEX) 100 MG tablet   . [DISCONTINUED] acetaminophen (TYLENOL) 325 MG tablet Take 650 mg by mouth every 6 (six) hours as needed for mild pain.  . [DISCONTINUED] amoxicillin-clavulanate (AUGMENTIN) 875-125 MG per tablet Take 1 tablet by mouth 2 (two) times daily.  . [DISCONTINUED] cephALEXin (KEFLEX) 500 MG capsule Take 1 capsule (500 mg total) by mouth 4 (four) times daily.  . [DISCONTINUED] ibuprofen (ADVIL,MOTRIN) 200 MG tablet Take 600 mg by mouth every 6 (six) hours as needed for moderate pain.   No  facility-administered encounter medications on file as of 01/04/2015.  :  Review of Systems:  Out of a complete 14 point review of systems, all are reviewed and negative with the exception of these symptoms as listed below:   Review of Systems  Constitutional: Positive for fatigue.  Respiratory: Positive for wheezing.        Snoring   Musculoskeletal:       Joint pain, joint swelling, cramps, aching muscles   Neurological:       Shift work, trouble falling asleep, some trouble staying asleep, states he feels like he doesn't "sleep at all", snoring, witnessed apnea, wakes up feeling tired, daytime tiredness, no morning headaches, denies taking naps during the day  Psychiatric/Behavioral:       Decreased energy     Objective:  Neurologic Exam  Physical Exam Physical Examination:   Filed Vitals:   01/04/15 0924  BP: 126/88  Pulse: 78  Resp: 16    General Examination: The patient is a very pleasant 44 y.o. male in no acute distress. He appears well-developed and well-nourished and well groomed. He is severely obese and has a shorter appearing neck.  HEENT: Normocephalic, atraumatic, pupils are equal, round and reactive to light and accommodation. Funduscopic exam is normal with sharp disc margins noted. Extraocular tracking is good without limitation to gaze excursion or nystagmus noted. Normal smooth pursuit is noted. Hearing is grossly intact. Tympanic membranes are clear bilaterally. Face is symmetric with normal facial animation and normal facial sensation. Speech is clear with no dysarthria noted. There is no hypophonia. There is no lip, neck/head, jaw or voice tremor. Neck is supple with full range of passive and active motion. There are no carotid bruits on auscultation. Oropharynx exam reveals: mild mouth dryness, good dental hygiene and moderate airway crowding, due to thicker soft palate, larger tongue and tonsils in place, 1+ bilaterally. Neck circumference is 19 inches. He  does appear to have a short neck.Mallampati is class I. tongue protrudes centrally and palate elevates symmetrically.  He has a mild overbite.  Chest: Clear to auscultation without wheezing, rhonchi or crackles noted.  Heart: S1+S2+0, regular and normal without murmurs, rubs or gallops noted.   Abdomen: Soft, non-tender and non-distended with normal bowel sounds appreciated on auscultation.  Extremities: There is no pitting edema in the distal lower extremities bilaterally. Pedal pulses are intact.  Skin: Warm and dry without trophic changes noted. There are no varicose veins.  Musculoskeletal: exam reveals no obvious joint deformities, tenderness or joint swelling or erythema.   Neurologically:  Mental status: The patient is awake, alert and oriented in all 4 spheres. His immediate and remote memory, attention, language skills and fund of knowledge are appropriate. There is no evidence of aphasia, agnosia,  apraxia or anomia. Speech is clear with normal prosody and enunciation. Thought process is linear. Mood is normal and affect is normal.  Cranial nerves II - XII are as described above under HEENT exam. In addition: shoulder shrug is normal with equal shoulder height noted. Motor exam: Normal bulk, strength and tone is noted. There is no drift, tremor or rebound. Romberg is negative. Reflexes are 2+ throughout. Babinski: Toes are flexor bilaterally. Fine motor skills and coordination: intact with normal finger taps, normal hand movements, normal rapid alternating patting, normal foot taps and normal foot agility.  Cerebellar testing: No dysmetria or intention tremor on finger to nose testing. Heel to shin is unremarkable bilaterally. There is no truncal or gait ataxia.  Sensory exam: intact to light touch, pinprick, vibration, temperature sense in the upper and lower extremities.  Gait, station and balance: He stands easily. No veering to one side is noted. No leaning to one side is noted.  Posture is age-appropriate and stance is narrow based. Gait shows normal stride length and normal pace. No problems turning are noted. He turns en bloc. Tandem walk is unremarkable.   Assessment and Plan:  In summary, Ciaran Begay is a very pleasant 44 y.o.-year old male with an underlying medical history of hyperlipidemia, type 2 diabetes, glaucoma, asthma,  reflux disease, anxiety, hypertension, and severe obesity, whose history and physical exam in keeping with obstructive sleep apnea (OSA). I had a long chat with the patient about my findings and the diagnosis of OSA, its prognosis and treatment options. We talked about medical treatments, surgical interventions and non-pharmacological approaches. I explained in particular the risks and ramifications of untreated moderate to severe OSA, especially with respect to developing cardiovascular disease down the Road, including congestive heart failure, difficult to treat hypertension, cardiac arrhythmias, or stroke. Even type 2 diabetes has, in part, been linked to untreated OSA. Symptoms of untreated OSA include daytime sleepiness, memory problems, mood irritability and mood disorder such as depression and anxiety, lack of energy, as well as recurrent headaches, especially morning headaches. We talked about trying to maintain a healthy lifestyle in general, as well as the importance of weight control. I encouraged the patient to eat healthy, exercise daily and keep well hydrated, to keep a scheduled bedtime and wake time routine, to not skip any meals and eat healthy snacks in between meals. I advised the patient not to drive when feeling sleepy. I recommended the following at this time: sleep study with potential positive airway pressure titration. (We will score hypopneas at 4% and split the sleep study into diagnostic and treatment portion, if the estimated. 2 hour AHI is >20/h).   I explained the sleep test procedure to the patient and also outlined possible  surgical and non-surgical treatment options of OSA, including the use of a custom-made dental device (which would require a referral to a specialist dentist or oral surgeon), upper airway surgical options, such as pillar implants, radiofrequency surgery, tongue base surgery, and UPPP (which would involve a referral to an ENT surgeon). Rarely, jaw surgery such as mandibular advancement may be considered.  I also explained the CPAP treatment option to the patient, who indicated that he would be willing to try CPAP if the need arises. I explained the importance of being compliant with PAP treatment, not only for insurance purposes but primarily to improve His symptoms, and for the patient's long term health benefit, including to reduce His cardiovascular risks. I answered all his questions today and the patient  was in agreement. I would like to see him back after the sleep study is completed and encouraged him to call with any interim questions, concerns, problems or updates.   Thank you very much for allowing me to participate in the care of this nice patient. If I can be of any further assistance to you please do not hesitate to call me at [phone].  Sincerely,   Star Age, MD, PhD

## 2015-02-03 ENCOUNTER — Ambulatory Visit (INDEPENDENT_AMBULATORY_CARE_PROVIDER_SITE_OTHER): Payer: 59 | Admitting: Neurology

## 2015-02-03 DIAGNOSIS — G4710 Hypersomnia, unspecified: Secondary | ICD-10-CM

## 2015-02-03 DIAGNOSIS — G4730 Sleep apnea, unspecified: Secondary | ICD-10-CM

## 2015-02-03 DIAGNOSIS — G4733 Obstructive sleep apnea (adult) (pediatric): Secondary | ICD-10-CM | POA: Diagnosis not present

## 2015-02-04 NOTE — Sleep Study (Signed)
Please see the scanned sleep study interpretation located in the procedure tab in the chart view section.  

## 2015-02-07 ENCOUNTER — Telehealth: Payer: Self-pay | Admitting: Neurology

## 2015-02-07 DIAGNOSIS — G4733 Obstructive sleep apnea (adult) (pediatric): Secondary | ICD-10-CM

## 2015-02-07 DIAGNOSIS — G4710 Hypersomnia, unspecified: Secondary | ICD-10-CM

## 2015-02-07 DIAGNOSIS — G4730 Sleep apnea, unspecified: Secondary | ICD-10-CM

## 2015-02-07 NOTE — Telephone Encounter (Signed)
LM to call back for sleep study results.

## 2015-02-07 NOTE — Telephone Encounter (Signed)
Patient referred by Dr. Reynaldo Minium, seen by me on 01/04/15, PSG on 02/03/15, ins: [employer] Please call and notify the patient that the recent sleep study did confirm the diagnosis of obstructive sleep apnea and that I recommend treatment for this in the form of CPAP. This will require a repeat sleep study for proper titration and mask fitting. Please explain to patient and arrange for a CPAP titration study. I have placed an order in the chart. Thanks, and please route to [facility] for scheduling. Star Age, MD, PhD [facility])

## 2015-02-08 NOTE — Telephone Encounter (Signed)
I spoke to patient and he is aware of results and would like to proceed with titration study. I will send to [facility] for scheduling. I will also fax report to PCP>

## 2015-03-01 ENCOUNTER — Ambulatory Visit (INDEPENDENT_AMBULATORY_CARE_PROVIDER_SITE_OTHER): Payer: 59 | Admitting: Neurology

## 2015-03-01 DIAGNOSIS — G4733 Obstructive sleep apnea (adult) (pediatric): Secondary | ICD-10-CM

## 2015-03-02 NOTE — Sleep Study (Signed)
Please see the scanned sleep study interpretation located in the Procedure tab within the Chart Review section. 

## 2015-03-07 ENCOUNTER — Telehealth: Payer: Self-pay | Admitting: Neurology

## 2015-03-07 DIAGNOSIS — G4733 Obstructive sleep apnea (adult) (pediatric): Secondary | ICD-10-CM

## 2015-03-07 NOTE — Telephone Encounter (Signed)
Patient referred by Dr. Reynaldo Minium, seen by me on 01/04/15, PSG on 02/03/15, CPAP study on 03/01/15, ins: [employer].  Please call and inform patient that I have entered an order for treatment with positive airway pressure (PAP) treatment of obstructive sleep apnea (OSA). He did well during the latest sleep study with CPAP. We will, therefore, arrange for a machine for home use through a DME (durable medical equipment) company of His choice; and I will see the patient back in follow-up in about 8-10 weeks. Please also explain to the patient that I will be looking out for compliance data, which can be downloaded from the machine (stored on an SD card, that is inserted in the machine) or via remote access through a modem, that is built into the machine. At the time of the followup appointment we will discuss sleep study results and how it is going with PAP treatment at home. Please advise patient to bring His machine at the time of the first FU visit, even though this is cumbersome. Bringing the machine for every visit after that will likely not be needed, but often helps for the first visit to troubleshoot if needed. Please re-enforce the importance of compliance with treatment and the need for [place] to monitor compliance data - often an insurance requirement and actually good feedback for the patient as far as how they are doing.  Also remind patient, that any interim PAP machine or mask issues should be first addressed with the DME company, as they can often help better with technical and mask fit issues. Please ask if patient has a preference regarding DME company.  Please also make sure, the patient has a follow-up appointment with me in about 8-10 weeks from the setup date, thanks.  Once you have spoken to the patient - and faxed/routed report to PCP and referring MD (if other than PCP), you can close this encounter, thanks,   Star Age, MD, PhD [facility] ([place])

## 2015-03-08 NOTE — Telephone Encounter (Signed)
Left message for patient to call back  

## 2015-03-09 NOTE — Telephone Encounter (Signed)
I spoke to patient and gave him results of titration study. Patient is willing to proceed with treatment. I will send CPAP order to DME company in [place] that accept [facility]. I will fax report to PCP. F/U appt made. I will send patient a letter reminding him to keep appt and stress the importance of compliance.

## 2015-05-01 ENCOUNTER — Encounter (HOSPITAL_COMMUNITY): Payer: Self-pay | Admitting: Nurse Practitioner

## 2015-05-01 ENCOUNTER — Emergency Department (HOSPITAL_COMMUNITY)
Admission: EM | Admit: 2015-05-01 | Discharge: 2015-05-02 | Disposition: A | Discharge status: Home / Self Care | Payer: 59 | Attending: Emergency Medicine | Admitting: Emergency Medicine

## 2015-05-01 ENCOUNTER — Emergency Department (HOSPITAL_COMMUNITY): Payer: 59

## 2015-05-01 DIAGNOSIS — Z8659 Personal history of other mental and behavioral disorders: Secondary | ICD-10-CM | POA: Diagnosis not present

## 2015-05-01 DIAGNOSIS — N200 Calculus of kidney: Secondary | ICD-10-CM | POA: Insufficient documentation

## 2015-05-01 DIAGNOSIS — Z792 Long term (current) use of antibiotics: Secondary | ICD-10-CM | POA: Insufficient documentation

## 2015-05-01 DIAGNOSIS — I10 Essential (primary) hypertension: Secondary | ICD-10-CM | POA: Insufficient documentation

## 2015-05-01 DIAGNOSIS — Z8669 Personal history of other diseases of the nervous system and sense organs: Secondary | ICD-10-CM | POA: Diagnosis not present

## 2015-05-01 DIAGNOSIS — D849 Immunodeficiency, unspecified: Secondary | ICD-10-CM | POA: Insufficient documentation

## 2015-05-01 DIAGNOSIS — E119 Type 2 diabetes mellitus without complications: Secondary | ICD-10-CM | POA: Diagnosis not present

## 2015-05-01 DIAGNOSIS — Z7984 Long term (current) use of oral hypoglycemic drugs: Secondary | ICD-10-CM | POA: Diagnosis not present

## 2015-05-01 DIAGNOSIS — E669 Obesity, unspecified: Secondary | ICD-10-CM | POA: Insufficient documentation

## 2015-05-01 DIAGNOSIS — Z79899 Other long term (current) drug therapy: Secondary | ICD-10-CM | POA: Insufficient documentation

## 2015-05-01 DIAGNOSIS — R112 Nausea with vomiting, unspecified: Secondary | ICD-10-CM

## 2015-05-01 DIAGNOSIS — R109 Unspecified abdominal pain: Secondary | ICD-10-CM

## 2015-05-01 DIAGNOSIS — K219 Gastro-esophageal reflux disease without esophagitis: Secondary | ICD-10-CM | POA: Insufficient documentation

## 2015-05-01 DIAGNOSIS — R350 Frequency of micturition: Secondary | ICD-10-CM

## 2015-05-01 LAB — CBC WITH DIFFERENTIAL/PLATELET
Basophils Absolute: 0 10*3/uL (ref 0.0–0.1)
Basophils Relative: 0 %
EOS PCT: 1 %
Eosinophils Absolute: 0.2 10*3/uL (ref 0.0–0.7)
HCT: 38.2 % — ABNORMAL LOW (ref 39.0–52.0)
HEMOGLOBIN: 12.7 g/dL — AB (ref 13.0–17.0)
LYMPHS ABS: 1.5 10*3/uL (ref 0.7–4.0)
LYMPHS PCT: 10 %
MCH: 28.8 pg (ref 26.0–34.0)
MCHC: 33.2 g/dL (ref 30.0–36.0)
MCV: 86.6 fL (ref 78.0–100.0)
MONOS PCT: 5 %
Monocytes Absolute: 0.7 10*3/uL (ref 0.1–1.0)
Neutro Abs: 13.3 10*3/uL — ABNORMAL HIGH (ref 1.7–7.7)
Neutrophils Relative %: 84 %
PLATELETS: 346 10*3/uL (ref 150–400)
RBC: 4.41 MIL/uL (ref 4.22–5.81)
RDW: 13.9 % (ref 11.5–15.5)
WBC: 15.7 10*3/uL — ABNORMAL HIGH (ref 4.0–10.5)

## 2015-05-01 LAB — COMPREHENSIVE METABOLIC PANEL
ALBUMIN: 4.2 g/dL (ref 3.5–5.0)
ALT: 16 U/L — AB (ref 17–63)
AST: 26 U/L (ref 15–41)
Alkaline Phosphatase: 47 U/L (ref 38–126)
Anion gap: 13 (ref 5–15)
BUN: 11 mg/dL (ref 6–20)
CO2: 22 mmol/L (ref 22–32)
CREATININE: 1.12 mg/dL (ref 0.61–1.24)
Calcium: 8.8 mg/dL — ABNORMAL LOW (ref 8.9–10.3)
Chloride: 102 mmol/L (ref 101–111)
GFR calc non Af Amer: >60 mL/min (ref >60)
GLUCOSE: 225 mg/dL — AB (ref 65–99)
Potassium: 4.2 mmol/L (ref 3.5–5.1)
SODIUM: 137 mmol/L (ref 135–145)
Total Bilirubin: 0.4 mg/dL (ref 0.3–1.2)
Total Protein: 7.6 g/dL (ref 6.5–8.1)

## 2015-05-01 LAB — URINALYSIS, ROUTINE W REFLEX MICROSCOPIC
Bilirubin Urine: NEGATIVE
Glucose, UA: NEGATIVE mg/dL
Hgb urine dipstick: NEGATIVE
Ketones, ur: NEGATIVE mg/dL
LEUKOCYTES UA: NEGATIVE
NITRITE: NEGATIVE
PROTEIN: NEGATIVE mg/dL
Specific Gravity, Urine: 1.026 (ref 1.005–1.030)
pH: 5.5 (ref 5.0–8.0)

## 2015-05-01 LAB — LIPASE, BLOOD: Lipase: 36 U/L (ref 11–51)

## 2015-05-01 MED ORDER — MORPHINE SULFATE (PF) 4 MG/ML IV SOLN
4.0000 mg | Freq: Once | INTRAVENOUS | Status: AC
  Administered 2015-05-01: 4 mg via INTRAVENOUS
  Filled 2015-05-01: qty 1

## 2015-05-01 MED ORDER — SODIUM CHLORIDE 0.9 % IV BOLUS (SEPSIS)
1000.0000 mL | Freq: Once | INTRAVENOUS | Status: AC
  Administered 2015-05-01: 1000 mL via INTRAVENOUS

## 2015-05-01 MED ORDER — ONDANSETRON HCL 4 MG/2ML IJ SOLN
4.0000 mg | Freq: Once | INTRAMUSCULAR | Status: AC
  Administered 2015-05-01: 4 mg via INTRAVENOUS
  Filled 2015-05-01: qty 2

## 2015-05-01 MED ORDER — ONDANSETRON 8 MG PO TBDP: 8.0000 mg | ORAL_TABLET | Freq: Three times a day (TID) | ORAL | Status: DC | PRN

## 2015-05-01 MED ORDER — NAPROXEN 500 MG PO TABS: 500.0000 mg | ORAL_TABLET | Freq: Two times a day (BID) | ORAL | Status: DC | PRN

## 2015-05-01 MED ORDER — HYDROCODONE-ACETAMINOPHEN 5-325 MG PO TABS: 1.0000 | ORAL_TABLET | Freq: Four times a day (QID) | ORAL | Status: DC | PRN

## 2015-05-01 MED ORDER — TAMSULOSIN HCL 0.4 MG PO CAPS: 0.4000 mg | ORAL_CAPSULE | Freq: Every day | ORAL | Status: DC

## 2015-05-01 NOTE — ED Notes (Addendum)
Pt c/o flank pain that he is localizing on his CVA aspect, states the pain radiates to right front groin/scrotal aspect giving him urinary urgency. Medics administered IV Toradol 30 mg to Pt en route and he remarks on relief rating pain as a 0.

## 2015-05-01 NOTE — ED Notes (Signed)
Bed: [id number] Expected date:  Expected time:  Means of arrival:  Comments: EMS 44yo M flank pain, hx of kidney stones

## 2015-05-01 NOTE — ED Notes (Signed)
Main lab phlebotomist notified for the need of recollect on CBC @ this time.

## 2015-05-01 NOTE — ED Provider Notes (Signed)
CSN: [id number]     Arrival date & time 05/01/15  2030 History   First MD Initiated Contact with Patient 05/01/15 2106     Chief Complaint  Patient presents with  . Flank Pain  . Urinary Frequency     (Consider location/radiation/quality/duration/timing/severity/associated sxs/prior Treatment) HPI Comments: Benjamin Sharp is a 44 y.o. male with a PMHx of HTN, DM2, HLD, GERD, obesity, and nephrolithiasis, who presents to the ED with complaints of sudden onset right flank pain that began at 7 PM associated with urinary frequency and urgency as well as hesitancy. Patient states he awoke from a nap just prior to onset of symptoms. He reports that the pain is right flank is currently 4/10 constant but waxing and waning, dull, radiating to the right lateral abdomen, with no known aggravating factors, and improved significantly with Toradol given in route. Associated symptoms include nausea and 1 episode of nonbloody nonbilious emesis. Patient states this feels very similar to his prior kidney stones.  He denies any fevers, chills, chest pain, shortness breath, hematemesis, diarrhea, constipation, melena, hematochezia, dysuria, hematuria, testicular pain or swelling, penile discharge, malodorous urine, numbness, tingling, weakness, recent travel, sick contacts, suspicious food intake, or alcohol use. He endorses chronic NSAID use since his surgery for tennis elbow on 12/2.  Patient is a 44 y.o. male presenting with flank pain and frequency. The history is provided by the patient. No language interpreter was used.  Flank Pain This is a recurrent problem. The current episode started today. The problem occurs constantly. The problem has been waxing and waning. Associated symptoms include abdominal pain (R lateral, from flank), nausea, urinary symptoms and vomiting. Pertinent negatives include no arthralgias, chest pain, chills, fever, myalgias, numbness or weakness. Nothing aggravates the symptoms. Treatments  tried: toradol. The treatment provided moderate relief.  Urinary Frequency Associated symptoms include abdominal pain (R lateral, from flank), nausea, urinary symptoms and vomiting. Pertinent negatives include no arthralgias, chest pain, chills, fever, myalgias, numbness or weakness.    Past Medical History  Diagnosis Date  . Hypertension   . Diabetes mellitus without complication ([place])   . Hyperlipemia   . Impaired glucose tolerance   . GERD (gastroesophageal reflux disease)   . Anxiety   . Sleep apnea   . Obesity    Past Surgical History  Procedure Laterality Date  . Hand surgery    . Gall stones-2015     Family History  Problem Relation Age of Onset  . Diabetes Mother   . Diabetes Father   . Cancer Father   . Diabetes Sister    Social History  Substance Use Topics  . Smoking status: Never Smoker   . Smokeless tobacco: None  . Alcohol Use: 0.0 oz/week    0 Standard drinks or equivalent per week     Comment: rarely    Review of Systems  Constitutional: Negative for fever and chills.  Respiratory: Negative for shortness of breath.   Cardiovascular: Negative for chest pain.  Gastrointestinal: Positive for nausea, vomiting and abdominal pain (R lateral, from flank). Negative for diarrhea, constipation and blood in stool.  Genitourinary: Positive for urgency, frequency and flank pain. Negative for dysuria, hematuria, discharge, scrotal swelling and testicular pain.  Musculoskeletal: Negative for myalgias and arthralgias.  Skin: Negative for color change.  Allergic/Immunologic: Positive for immunocompromised state (diabetic).  Neurological: Negative for weakness and numbness.  Psychiatric/Behavioral: Negative for confusion.   10 Systems reviewed and are negative for acute change except as noted in the HPI.  Allergies  Review of patient's allergies indicates no known allergies.  Home Medications   Prior to Admission medications   Medication Sig Start Date End  Date Taking? Authorizing Provider  metFORMIN (GLUCOPHAGE) 1000 MG tablet  12/28/14   Historical Provider, MD  pantoprazole (PROTONIX) 40 MG tablet  12/10/14   Historical Provider, MD  trimethoprim (TRIMPEX) 100 MG tablet  11/10/14   Historical Provider, MD   BP 135/86 mmHg  Pulse 90  Temp(Src) 97.9 F (36.6 C) (Oral)  Resp 20  SpO2 98% Physical Exam  Constitutional: He is oriented to person, place, and time. Vital signs are normal. He appears well-developed and well-nourished.  Non-toxic appearance. No distress.  Afebrile, nontoxic, NAD  HENT:  Head: Normocephalic and atraumatic.  Mouth/Throat: Oropharynx is clear and moist and mucous membranes are normal.  Eyes: Conjunctivae and EOM are normal. Right eye exhibits no discharge. Left eye exhibits no discharge.  Neck: Normal range of motion. Neck supple.  Cardiovascular: Normal rate, regular rhythm, normal heart sounds and intact distal pulses.  Exam reveals no gallop and no friction rub.   No murmur heard. Pulmonary/Chest: Effort normal and breath sounds normal. No respiratory distress. He has no decreased breath sounds. He has no wheezes. He has no rhonchi. He has no rales.  Abdominal: Soft. Normal appearance and bowel sounds are normal. He exhibits no distension. There is tenderness. There is CVA tenderness (R sided). There is no rigidity, no rebound, no guarding, no tenderness at McBurney's point and negative Murphy's sign.    Soft, obese but nondistended, +BS throughout, with mild R lateral abdomen TTP nearing the R CVA, no r/g/r, neg murphy's, neg mcburney's, mild R CVA TTP   Musculoskeletal: Normal range of motion.  Neurological: He is alert and oriented to person, place, and time. He has normal strength. No sensory deficit.  Skin: Skin is warm, dry and intact. No rash noted.  Psychiatric: He has a normal mood and affect.  Nursing note and vitals reviewed.   ED Course  Procedures (including critical care time) Labs Review Labs  Reviewed  URINALYSIS, ROUTINE W REFLEX MICROSCOPIC (NOT AT [facility]) - Abnormal; Notable for the following:    APPearance CLOUDY (*)    All other components within normal limits  COMPREHENSIVE METABOLIC PANEL - Abnormal; Notable for the following:    Glucose, Bld 225 (*)    Calcium 8.8 (*)    ALT 16 (*)    All other components within normal limits  CBC WITH DIFFERENTIAL/PLATELET - Abnormal; Notable for the following:    WBC 15.7 (*)    Hemoglobin 12.7 (*)    HCT 38.2 (*)    Neutro Abs 13.3 (*)    All other components within normal limits  LIPASE, BLOOD  CBC WITH DIFFERENTIAL/PLATELET    Imaging Review Ct Renal Stone Study  05/01/2015  CLINICAL DATA:  Right flank pain radiating to the right groin and scrotal area. Urinary urgency. EXAM: CT ABDOMEN AND PELVIS WITHOUT CONTRAST TECHNIQUE: Multidetector CT imaging of the abdomen and pelvis was performed following the standard protocol without IV contrast. COMPARISON:  01/17/2015 FINDINGS: Lung bases are clear. Multiple small intrarenal stones in the right kidney, largest measuring about 2 mm diameter. Stone in the distal right ureter just above the ureterovesical junction measuring about 2 mm diameter. Mild stranding around the right ureter and right kidney. No hydronephrosis or hydroureter. Left kidney and ureter appear normal. Bladder is decompressed. There is mild infiltration in the subcutaneous fat around the head of  the pancreas which could represent early changes of acute pancreatitis. No pancreatic ductal dilatation. Mild fatty infiltration of the pancreas. The unenhanced appearance of the liver, spleen, gallbladder, adrenal glands, abdominal aorta, inferior vena cava, and retroperitoneal lymph nodes is unremarkable. Stomach, small bowel, and colon are decompressed. No free air or free fluid in the abdomen. Abdominal wall musculature appears intact. Pelvis: The appendix is normal. Prostate gland is not enlarged. No free or loculated pelvic fluid  collections. No pelvic mass or lymphadenopathy. Mild degenerative changes in the spine. No destructive bone lesions. IMPRESSION: 2 mm stone in the distal right ureter with minimal proximal obstruction. Additional nonobstructing stones in the right kidney. Vague infiltration in the fat around the head of the pancreas could indicate early changes of focal pancreatitis. Electronically Signed   By: Lucienne Capers M.D.   On: 05/01/2015 22:16   I have personally reviewed and evaluated these images and lab results as part of my medical decision-making.   EKG Interpretation None      MDM   Final diagnoses:  Right flank pain  Nephrolithiasis  Urinary frequency  Non-intractable vomiting with nausea, vomiting of unspecified type    44 y.o. male here with sudden onset R flank pain and n/v, feels like prior stones. On exam, mild R lateral abd tenderness near CVA, neg murphys, no RLQ tenderness. Had toradol prior to arrival which helped, but some pain now recurring. Will get labs, U/A, CT stone search, and give pain meds/nausea meds. Will give fluids and reassess shortly.   12:01 AM Some delays with labs occurred but pt has had full resolution of pain and nausea, tolerating PO well now. Labs reveal mildly elevated WBC which could be from stress demargination, and seems to be present at every visit in the past. CMP with gluc 225 but otherwise unremarkable. Lipase WNL. U/A without evidence of infection, nitrite and leuk neg. CT showing 77mm distal R ureteral stone, also some vague ?pancreatitis changes but lipase WNL therefore doubt acute pancreatitis. Discussed use of zofran, flomax, naprosyn, and norco, and f/up with his urologist in 5-7 days for recheck. I explained the diagnosis and have given explicit precautions to return to the ER including for any other new or worsening symptoms. The patient understands and accepts the medical plan as it's been dictated and I have answered their questions. Discharge  instructions concerning home care and prescriptions have been given. The patient is STABLE and is discharged to home in good condition.  BP 135/86 mmHg  Pulse 90  Temp(Src) 97.9 F (36.6 C) (Oral)  Resp 20  SpO2 98%  Meds ordered this encounter  Medications  . ondansetron (ZOFRAN) injection 4 mg    Sig:   . morphine 4 MG/ML injection 4 mg    Sig:   . sodium chloride 0.9 % bolus 1,000 mL    Sig:   . naproxen (NAPROSYN) 500 MG tablet    Sig: Take 1 tablet (500 mg total) by mouth 2 (two) times daily as needed for mild pain, moderate pain or headache (TAKE WITH MEALS.).    Dispense:  20 tablet    Refill:  0    Order Specific Question:  Supervising Provider    Answer:  MILLER, BRIAN [[id number]]  . HYDROcodone-acetaminophen (NORCO) 5-325 MG tablet    Sig: Take 1 tablet by mouth every 6 (six) hours as needed for severe pain.    Dispense:  10 tablet    Refill:  0    Order Specific Question:  Supervising Provider    Answer:  Noemi Chapel [[id number]]  . tamsulosin (FLOMAX) 0.4 MG CAPS capsule    Sig: Take 1 capsule (0.4 mg total) by mouth daily after supper.    Dispense:  15 capsule    Refill:  0    Order Specific Question:  Supervising Provider    Answer:  MILLER, BRIAN [[id number]]  . ondansetron (ZOFRAN ODT) 8 MG disintegrating tablet    Sig: Take 1 tablet (8 mg total) by mouth every 8 (eight) hours as needed for nausea or vomiting.    Dispense:  10 tablet    Refill:  0    Order Specific Question:  Supervising Provider    Answer:  Noemi Chapel [[id number]]     June Vacha Camprubi-Soms, PA-C 05/02/15 [place], MD 05/02/15 [phone]

## 2015-05-01 NOTE — ED Notes (Signed)
Below order not completed by EW. 

## 2015-05-02 NOTE — Discharge Instructions (Signed)
Take naprosyn as directed as needed for inflammation and pain using norco for breakthrough pain. Do not drive or operate machinery with pain medication use. May need over-the-counter stool softener with this pain medication use. Use Zofran as needed for nausea. Stay well hydrated with water. Use Flomax as directed, as this medication will help you pass the stone. Followup with urologist in the next 3-5 days for recheck of ongoing pain, however for intractable or uncontrollable pain at home then return to the emergency department.     Flank Pain Flank pain is pain in your side. The flank is the area of your side between your upper belly (abdomen) and your back. Pain in this area can be caused by many different things. [place] care and treatment will depend on the cause of your pain.  Rest as told by your doctor.  Drink enough fluids to keep your pee (urine) clear or pale yellow.  Only take medicine as told by your doctor.  Tell your doctor about any changes in your pain.  Follow up with your doctor. GET HELP RIGHT AWAY IF:   Your pain does not get better with medicine.   You have new symptoms or your symptoms get worse.  Your pain gets worse.   You have belly (abdominal) pain.   You are short of breath.   You always feel sick to your stomach (nauseous).   You keep throwing up (vomiting).   You have puffiness (swelling) in your belly.   You feel light-headed or you pass out (faint).   You have blood in your pee.  You have a fever or lasting symptoms for more than 2-3 days.  You have a fever and your symptoms suddenly get worse. MAKE SURE YOU:   Understand these instructions.  Will watch your condition.  Will get help right away if you are not doing well or get worse.   This information is not intended to replace advice given to you by your health care provider. Make sure you discuss any questions you have with your health care provider.   Document  Released: 02/14/2008 Document Revised: 05/28/2014 Document Reviewed: 12/20/2011 Elsevier Interactive Patient Education 2016 [place].  Kidney Stones Kidney stones (urolithiasis) are deposits that form inside your kidneys. The intense pain is caused by the stone moving through the urinary tract. When the stone moves, the ureter goes into spasm around the stone. The stone is usually passed in the urine.  CAUSES   A disorder that makes certain neck glands produce too much parathyroid hormone (primary hyperparathyroidism).  A buildup of uric acid crystals, similar to gout in your joints.  Narrowing (stricture) of the ureter.  A kidney obstruction present at birth (congenital obstruction).  Previous surgery on the kidney or ureters.  Numerous kidney infections. SYMPTOMS   Feeling sick to your stomach (nauseous).  Throwing up (vomiting).  Blood in the urine (hematuria).  Pain that usually spreads (radiates) to the groin.  Frequency or urgency of urination. DIAGNOSIS   Taking a history and physical exam.  Blood or urine tests.  CT scan.  Occasionally, an examination of the inside of the urinary bladder (cystoscopy) is performed. TREATMENT   Observation.  Increasing your fluid intake.  Extracorporeal shock wave lithotripsy--This is a noninvasive procedure that uses shock waves to break up kidney stones.  Surgery may be needed if you have severe pain or persistent obstruction. There are various surgical procedures. Most of the procedures are performed with the use of  small instruments. Only small incisions are needed to accommodate these instruments, so recovery time is minimized. The size, location, and chemical composition are all important variables that will determine the proper choice of action for you. Talk to your health care provider to better understand your situation so that you will minimize the risk of injury to yourself and your kidney.  HOME CARE INSTRUCTIONS    Drink enough water and fluids to keep your urine clear or pale yellow. This will help you to pass the stone or stone fragments.  Strain all urine through the provided strainer. Keep all particulate matter and stones for your health care provider to see. The stone causing the pain may be as small as a grain of salt. It is very important to use the strainer each and every time you pass your urine. The collection of your stone will allow your health care provider to analyze it and verify that a stone has actually passed. The stone analysis will often identify what you can do to reduce the incidence of recurrences.  Only take over-the-counter or prescription medicines for pain, discomfort, or fever as directed by your health care provider.  Keep all follow-up visits as told by your health care provider. This is important.  Get follow-up X-rays if required. The absence of pain does not always mean that the stone has passed. It may have only stopped moving. If the urine remains completely obstructed, it can cause loss of kidney function or even complete destruction of the kidney. It is your responsibility to make sure X-rays and follow-ups are completed. Ultrasounds of the kidney can show blockages and the status of the kidney. Ultrasounds are not associated with any radiation and can be performed easily in a matter of minutes.  Make changes to your daily diet as told by your health care provider. You may be told to:  Limit the amount of salt that you eat.  Eat 5 or more servings of fruits and vegetables each day.  Limit the amount of meat, poultry, fish, and eggs that you eat.  Collect a 24-hour urine sample as told by your health care provider.You may need to collect another urine sample every 6-12 months. SEEK MEDICAL CARE IF:  You experience pain that is progressive and unresponsive to any pain medicine you have been prescribed. SEEK IMMEDIATE MEDICAL CARE IF:   Pain cannot be controlled  with the prescribed medicine.  You have a fever or shaking chills.  The severity or intensity of pain increases over 18 hours and is not relieved by pain medicine.  You develop a new onset of abdominal pain.  You feel faint or pass out.  You are unable to urinate.   This information is not intended to replace advice given to you by your health care provider. Make sure you discuss any questions you have with your health care provider.   Document Released: 05/07/2005 Document Revised: 01/26/2015 Document Reviewed: 10/08/2012 Elsevier Interactive Patient Education 2016 [place] are compounds that affect the level of uric acid in your body. A low-purine diet is a diet that is low in purines. Eating a low-purine diet can prevent the level of uric acid in your body from getting too high and causing gout or kidney stones or both. WHAT DO I NEED TO KNOW ABOUT THIS DIET?  Choose low-purine foods. Examples of low-purine foods are listed in the next section.  Drink plenty of fluids, especially water. Fluids can help remove uric  acid from your body. Try to drink 8-16 cups (1.9-3.8 L) a day.  Limit foods high in fat, especially saturated fat, as fat makes it harder for the body to get rid of uric acid. Foods high in saturated fat include pizza, cheese, ice cream, whole milk, fried foods, and gravies. Choose foods that are lower in fat and lean sources of protein. Use olive oil when cooking as it contains healthy fats that are not high in saturated fat.  Limit alcohol. Alcohol interferes with the elimination of uric acid from your body. If you are having a gout attack, avoid all alcohol.  Keep in mind that different people's bodies react differently to different foods. You will probably learn over time which foods do or do not affect you. If you discover that a food tends to cause your gout to flare up, avoid eating that food. You can more freely enjoy foods that do not  cause problems. If you have any questions about a food item, talk to your dietitian or health care provider. WHICH FOODS ARE LOW, MODERATE, AND HIGH IN PURINES? The following is a list of foods that are low, moderate, and high in purines. You can eat any amount of the foods that are low in purines. You may be able to have small amounts of foods that are moderate in purines. Ask your health care provider how much of a food moderate in purines you can have. Avoid foods high in purines. Grains  Foods low in purines: Enriched white bread, pasta, rice, cake, cornbread, popcorn.  Foods moderate in purines: Whole-grain breads and cereals, wheat germ, bran, oatmeal. Uncooked oatmeal. Dry wheat bran or wheat germ.  Foods high in purines: Pancakes, [place] toast, biscuits, muffins. Vegetables  Foods low in purines: All vegetables, except those that are moderate in purines.  Foods moderate in purines: Asparagus, cauliflower, spinach, mushrooms, green peas. Fruits  All fruits are low in purines. Meats and other Protein Foods  Foods low in purines: Eggs, nuts, peanut butter.  Foods moderate in purines: 80-90% lean beef, lamb, veal, pork, poultry, fish, eggs, peanut butter, nuts. Crab, lobster, oysters, and shrimp. Cooked dried beans, peas, and lentils.  Foods high in purines: Anchovies, sardines, herring, mussels, tuna, codfish, scallops, trout, and haddock. [place] Salines. Organ meats (such as liver or kidney). Tripe. Game meat. Goose. Sweetbreads. Dairy  All dairy foods are low in purines. Low-fat and fat-free dairy products are best because they are low in saturated fat. Beverages  Drinks low in purines: Water, carbonated beverages, tea, coffee, cocoa.  Drinks moderate in purines: Soft drinks and other drinks sweetened with high-fructose corn syrup. Juices. To find whether a food or drink is sweetened with high-fructose corn syrup, look at the ingredients list.  Drinks high in purines: Alcoholic  beverages (such as beer). Condiments  Foods low in purines: Salt, herbs, olives, pickles, relishes, vinegar.  Foods moderate in purines: Butter, margarine, oils, mayonnaise. Fats and Oils  Foods low in purines: All types, except gravies and sauces made with meat.  Foods high in purines: Gravies and sauces made with meat. Other Foods  Foods low in purines: Sugars, sweets, gelatin. Cake. Soups made without meat.  Foods moderate in purines: Meat-based or fish-based soups, broths, or bouillons. Foods and drinks sweetened with high-fructose corn syrup.  Foods high in purines: High-fat desserts (such as ice cream, cookies, cakes, pies, doughnuts, and chocolate). Contact your dietitian for more information on foods that are not listed here.   This information is  not intended to replace advice given to you by your health care provider. Make sure you discuss any questions you have with your health care provider.   Document Released: 09/01/2010 Document Revised: 05/12/2013 Document Reviewed: 04/13/2013 Elsevier Interactive Patient Education 2016 [employer]  Nausea and Vomiting Nausea means you feel sick to your stomach. Throwing up (vomiting) is a reflex where stomach contents come out of your mouth. HOME CARE   Take medicine as told by your doctor.  Do not force yourself to eat. However, you do need to drink fluids.  If you feel like eating, eat a normal diet as told by your doctor.  Eat rice, wheat, potatoes, bread, lean meats, yogurt, fruits, and vegetables.  Avoid high-fat foods.  Drink enough fluids to keep your pee (urine) clear or pale yellow.  Ask your doctor how to replace body fluid losses (rehydrate). Signs of body fluid loss (dehydration) include:  Feeling very thirsty.  Dry lips and mouth.  Feeling dizzy.  Dark pee.  Peeing less than normal.  Feeling confused.  Fast breathing or heart rate. GET HELP RIGHT AWAY IF:   You have blood in your throw  up.  You have black or bloody poop (stool).  You have a bad headache or stiff neck.  You feel confused.  You have bad belly (abdominal) pain.  You have chest pain or trouble breathing.  You do not pee at least once every 8 hours.  You have cold, clammy skin.  You keep throwing up after 24 to 48 hours.  You have a fever. MAKE SURE YOU:   Understand these instructions.  Will watch your condition.  Will get help right away if you are not doing well or get worse.   This information is not intended to replace advice given to you by your health care provider. Make sure you discuss any questions you have with your health care provider.   Document Released: 10/24/2007 Document Revised: 07/30/2011 Document Reviewed: 10/06/2010 Elsevier Interactive Patient Education 2016 [place].  Renal Colic Renal colic is pain that is caused by passing a kidney stone. The pain can be sharp and severe. It may be felt in the back, abdomen, side (flank), or groin. It can cause nausea. Renal colic can come and go. HOME CARE INSTRUCTIONS Watch your condition for any changes. The following actions may help to lessen any discomfort that you are feeling:  Take medicines only as directed by your health care provider.  Ask your health care provider if it is okay to take over-the-counter pain medicine.  Drink enough fluid to keep your urine clear or pale yellow. Drink 6-8 glasses of water each day.  Limit the amount of salt that you eat to less than 2 grams per day.  Reduce the amount of protein in your diet. Eat less meat, fish, nuts, and dairy.  Avoid foods such as spinach, rhubarb, nuts, or bran. These may make kidney stones more likely to form. SEEK MEDICAL CARE IF:  You have a fever or chills.  Your urine smells bad or looks cloudy.  You have pain or burning when you pass urine. SEEK IMMEDIATE MEDICAL CARE IF:  Your flank pain or groin pain suddenly worsens.  You become confused or  disoriented or you lose consciousness.   This information is not intended to replace advice given to you by your health care provider. Make sure you discuss any questions you have with your health care provider.   Document Released: 02/14/2005 Document Revised:  05/28/2014 Document Reviewed: 03/17/2014 Elsevier Interactive Patient Education [employer].

## 2015-05-19 ENCOUNTER — Ambulatory Visit: Payer: Self-pay | Admitting: Neurology

## 2015-05-25 ENCOUNTER — Encounter: Payer: Self-pay | Admitting: Neurology

## 2015-05-25 ENCOUNTER — Ambulatory Visit (INDEPENDENT_AMBULATORY_CARE_PROVIDER_SITE_OTHER): Payer: 59 | Admitting: Neurology

## 2015-05-25 VITALS — BP 144/98 | HR 92 | Resp 18 | Ht 68.25 in | Wt 281.0 lb

## 2015-05-25 DIAGNOSIS — G4733 Obstructive sleep apnea (adult) (pediatric): Secondary | ICD-10-CM

## 2015-05-25 DIAGNOSIS — Z9989 Dependence on other enabling machines and devices: Secondary | ICD-10-CM

## 2015-05-25 DIAGNOSIS — Z9889 Other specified postprocedural states: Secondary | ICD-10-CM

## 2015-05-25 NOTE — Progress Notes (Signed)
Subjective:    Patient ID: Benjamin Sharp is a 45 y.o. male.  HPI     Interim history:   Mr. Benjamin Sharp is a 45 year old right-handed gentleman with an underlying medical history of hyperlipidemia, type 2 diabetes, glaucoma, asthma,  reflux disease, anxiety, hypertension, and severe obesity, who presents for follow-up consultation of his OSA, after his recent sleep study. The patient is unaccompanied today. I first met him on 01/04/2015 at the request of his primary care physician, at which time the patient reported snoring, and excessive daytime somnolence. I invited him back for sleep study. He had a baseline sleep study, followed by a CPAP titration study. I went over his test results with him in detail today. His baseline sleep study from 02/03/2015 showed a sleep efficiency of 92.9% with a normal sleep latency of 13 minutes and wake after sleep onset of 16 minutes. He had a normal percentage of light stage sleep, a mildly increased percentage of slow-wave sleep and a normal percentage of REM sleep with a normal REM latency. He had no significant PLMS, EKG or EEG changes. He had moderate to loud snoring. Total AHI was 9 per hour, rising to 26.1 per hour during REM sleep. Average oxygen saturation was 95%, nadir was 85%. Based on his sleep related complaints I invited him back for a full night CPAP titration study. He had this on 03/01/2015. Sleep efficiency was 79.5% with a prolonged sleep latency of 32 minutes and wake after sleep onset of 63 minutes with mild sleep fragmentation noted. He had an increased percentage of stage II sleep, absence of slow-wave sleep and a decreased percentage of REM sleep at 14.3% with a prolonged REM latency. He had no significant PLMS, EKG or EEG changes. Average oxygen saturation was 96%, nadir was 90%. CPAP was titrated from 5 cm to 8 cm. AHI was 0 per hour on that pressure with supine REM sleep achieved. Based on his test results are prescribed CPAP therapy for home use.    Today, 05/25/2015: I reviewed his CPAP compliance data from 04/24/2015 through 05/23/2015 which is a total of 30 days during which time he used his machine only 3 days and has not used it since 04/29/2015. Residual AHI was 1.8, leak acceptable, percent used days greater than 4 hours was 0%, indicating noncompliance. Previously, from 04/14/2015 through 05/23/2015 which is a total of 40 days it looks like he used his CPAP for total of 12 days, indicating noncompliance.  Today, 05/25/2015: He reports that he had L elbow surgery on 04/22/15 and has started PT some 2 weeks ago and goes 1/week, off of narcotic pain medication, had a kidney stone soon after the surgery and with all of this he has not been able to use CPAP particularly because he also has limitation in his range of movements and has to wear a brace on his left elbow for total of 6+ weeks. His brace may come off at the end of this month. Overall, he reports that when he was on CPAP in the beginning and compliant with treatment he felt better. He felt better rested and less sleepy during the day and his sleep quality had improved. He is motivated to go back on treatment. He cannot quite move his left arm to touch his face and therefore with one arm it is very difficult for him to put the mask on which is understandable. His right elbow is also painful and may need surgery eventually as well.   Previously:  01/04/2015: He reports snoring and excessive daytime somnolence. He lives with his mother, sister and niece and all of them have reported his loud snoring and also told him that he has pauses in his breathing. He has woken himself up with loud snoring or snorting. He denies morning headaches. He has nighttime leg cramps that often wake him up. He denies frank restless leg symptoms. He sleeps alone and a queen size bed. He works full-time as a Counsellor. He used to work nights for about 9 years. In the past 7 or 8 years he has worked daytime hours  and works from 6:30 AM to 6:30 PM. Bedtime varies but is generally around 10 PM and he falls asleep quickly. His rise time is 5 AM he needs does not wake up rested. He is tired during the day. His Epworth sleepiness score is 12 out of 24, his fatigue score is 47 out of 63. He is not aware of any family history of obstructive sleep apnea. He has mild asthma which is usually under good control. He has a history of acid reflux but symptoms have been under control with Protonix. He has been diagnosed with glaucoma and has eyedrops for this. He goes once a year for his eye checkup. He drinks alcohol occasionally, about 4 beers a month. He drinks sodas occasionally, about 4-6 sodas a month. He is a nonsmoker. He is divorced and has no children.   His Past Medical History Is Significant For: Past Medical History  Diagnosis Date  . Hypertension   . Diabetes mellitus without complication ([place])   . Hyperlipemia   . Impaired glucose tolerance   . GERD (gastroesophageal reflux disease)   . Anxiety   . Sleep apnea   . Obesity   . Kidney stone     His Past Surgical History Is Significant For: Past Surgical History  Procedure Laterality Date  . Hand surgery    . Gall stones-2015    . Elbow surgery Left 2016    His Family History Is Significant For: Family History  Problem Relation Age of Onset  . Diabetes Mother   . Diabetes Father   . Cancer Father   . Diabetes Sister     His Social History Is Significant For: Social History   Social History  . Marital Status: Single    Spouse Name: N/A  . Number of Children: 0  . Years of Education: 12   Occupational History  . Dispatcher     Social History Main Topics  . Smoking status: Never Smoker   . Smokeless tobacco: None  . Alcohol Use: 0.0 oz/week    0 Standard drinks or equivalent per week     Comment: rarely  . Drug Use: No  . Sexual Activity: Not Asked   Other Topics Concern  . None   Social History Narrative   Drinks about  4-6 sodas a month    His Allergies Are:  No Known Allergies:   His Current Medications Are:  Outpatient Encounter Prescriptions as of 05/25/2015  Medication Sig  . ibuprofen (ADVIL,MOTRIN) 200 MG tablet Take 600 mg by mouth every 6 (six) hours as needed.  . metFORMIN (GLUCOPHAGE) 1000 MG tablet 1,000 mg 2 (two) times daily with a meal.   . pantoprazole (PROTONIX) 40 MG tablet 40 mg daily.   . [DISCONTINUED] acetaminophen (TYLENOL) 500 MG tablet Take 1,500 mg by mouth once.  . [DISCONTINUED] HYDROcodone-acetaminophen (NORCO) 5-325 MG tablet Take 1 tablet by mouth every 6 (  six) hours as needed for severe pain.  . [DISCONTINUED] naproxen (NAPROSYN) 500 MG tablet Take 1 tablet (500 mg total) by mouth 2 (two) times daily as needed for mild pain, moderate pain or headache (TAKE WITH MEALS.).  . [DISCONTINUED] ondansetron (ZOFRAN ODT) 8 MG disintegrating tablet Take 1 tablet (8 mg total) by mouth every 8 (eight) hours as needed for nausea or vomiting.  . [DISCONTINUED] tamsulosin (FLOMAX) 0.4 MG CAPS capsule Take 1 capsule (0.4 mg total) by mouth daily after supper.  . [DISCONTINUED] trimethoprim (TRIMPEX) 100 MG tablet    No facility-administered encounter medications on file as of 05/25/2015.  :  Review of Systems:  Out of a complete 14 point review of systems, all are reviewed and negative with the exception of these symptoms as listed below:   Review of Systems  Neurological:       Patient reports that he started off doing well on CPAP. Had elbow surgery on 04/22/15 and after that had trouble putting the mask on and tolerating anything on his face at the time.     Objective:  Neurologic Exam  Physical Exam Physical Examination:   Filed Vitals:   05/25/15 1046  BP: 144/98  Pulse: 92  Resp: 18    General Examination: The patient is a very pleasant 45 y.o. male in no acute distress. He appears well-developed and well-nourished and well groomed. He is in good spirits today.  HEENT:  Normocephalic, atraumatic, pupils are equal, round and reactive to light and accommodation. Extraocular tracking is good without limitation to gaze excursion or nystagmus noted. Normal smooth pursuit is noted. Hearing is grossly intact. Face is symmetric with normal facial animation and normal facial sensation. Speech is clear with no dysarthria noted. There is no hypophonia. There is no lip, neck/head, jaw or voice tremor. Neck is supple with full range of passive and active motion. There are no carotid bruits on auscultation. Oropharynx exam reveals: mild mouth dryness, good dental hygiene and moderate airway crowding, due to thicker soft palate, larger tongue and tonsils in place, 1+ bilaterally. He does appear to have a short neck.Mallampati is class I. tongue protrudes centrally and palate elevates symmetrically.  He has a mild overbite.  Chest: Clear to auscultation without wheezing, rhonchi or crackles noted.  Heart: S1+S2+0, regular and normal without murmurs, rubs or gallops noted.   Abdomen: Soft, non-tender and non-distended with normal bowel sounds appreciated on auscultation.  Extremities: There is no pitting edema in the distal lower extremities bilaterally. His left elbow is in a brace. He has mild limitation in range of motion in his right elbow as well.  Skin: Warm and dry without trophic changes noted. There are no varicose veins.  Musculoskeletal: exam reveals no obvious joint deformities, tenderness or joint swelling or erythema.   Neurologically:  Mental status: The patient is awake, alert and oriented in all 4 spheres. His immediate and remote memory, attention, language skills and fund of knowledge are appropriate. There is no evidence of aphasia, agnosia, apraxia or anomia. Speech is clear with normal prosody and enunciation. Thought process is linear. Mood is normal and affect is normal.  Cranial nerves II - XII are as described above under HEENT exam. In addition: shoulder  shrug is normal with equal shoulder height noted. Motor exam: Normal bulk, strength and tone is noted, although limited exam was performed in the left upper extremity. There is no drift, tremor or rebound. Romberg is negative. Reflexes are 2+ throughout, except left arm  was not tested as it is in a sling and brace. Fine motor skills and coordination: intact.  Cerebellar testing: No dysmetria or intention tremor on finger to nose testing. Heel to shin is unremarkable bilaterally. There is no truncal or gait ataxia.  Sensory exam: intact to light in the upper and lower extremities.  Gait, station and balance: He stands easily. No veering to one side is noted. No leaning to one side is noted. Posture is age-appropriate and stance is narrow based. Gait shows normal stride length and normal pace. No problems turning are noted. He turns en bloc. Tandem walk is unremarkable.   Assessment and Plan:  In summary, Stellan Vick is a very pleasant 63.-year old male with an underlying medical history of hyperlipidemia, type 2 diabetes, glaucoma, asthma,  reflux disease, anxiety, hypertension, and severe obesity, who presents for follow-up consultation of his mild to moderate obstructive sleep apnea. He had a baseline sleep study in September 2016 followed by a CPAP titration study in October 2016 and we talked about his test results in detail. When he first was compliant with treatment he felt improved in his sleep. Unfortunately, he had some setbacks since he established treatment with CPAP. He needed left elbow surgery and has to be in a brace with his left elbow for total of 6+ weeks. He has now recently started physical therapy and is also exercising at home, particularly with light stretching exercises. He has limitation in his mobility with his left arm and cannot tighten his mask properly. Therefore, he has not been able to be compliant with CPAP therapy lately since his surgery which is understandable. Hopefully this  will improve with time. He also had a setback with a kidney stone soon after his elbow surgery. Things are improving slowly and he is making progress. He is motivated to go back on treatment with CPAP, particularly because he felt improved when he started therapy. He is advised about CPAP compliance, he is advised about his study results and his compliance data. We talked about weight loss as well. At this juncture, I suggested a 3 month follow-up to track his progress and we can space out his follow-up appointments after that. I answered all his questions today and the patient was in agreement.  I spent 25 minutes in total face-to-face time with the patient, more than 50% of which was spent in counseling and coordination of care, reviewing test results, reviewing medication and discussing or reviewing the diagnosis of OSA, its prognosis and treatment options.

## 2015-05-25 NOTE — Patient Instructions (Signed)
Please continue using your CPAP regularly. While your insurance requires that you use CPAP at least 4 hours each night on 70% of the nights, I recommend, that you not skip any nights and use it throughout the night if you can. Getting used to CPAP and staying with the treatment long term does take time and patience and discipline. Untreated obstructive sleep apnea when it is moderate to severe can have an adverse impact on cardiovascular health and raise her risk for heart disease, arrhythmias, hypertension, congestive heart failure, stroke and diabetes. Untreated obstructive sleep apnea causes sleep disruption, nonrestorative sleep, and sleep deprivation. This can have an impact on your day to day functioning and cause daytime sleepiness and impairment of cognitive function, memory loss, mood disturbance, and problems focussing. Using CPAP regularly can improve these symptoms.  Hope things are coming along well with your left elbow.

## 2015-08-21 ENCOUNTER — Ambulatory Visit (INDEPENDENT_AMBULATORY_CARE_PROVIDER_SITE_OTHER): Payer: 59 | Admitting: Family Medicine

## 2015-08-21 VITALS — BP 134/86 | HR 118 | Temp 98.2°F | Resp 98 | Ht 67.0 in | Wt 287.2 lb

## 2015-08-21 DIAGNOSIS — J301 Allergic rhinitis due to pollen: Secondary | ICD-10-CM | POA: Diagnosis not present

## 2015-08-21 MED ORDER — CODEINE POLT-CHLORPHEN POLT ER 14.7-2.8 MG/5ML PO SUER: 10.0000 mL | Freq: Two times a day (BID) | ORAL | Status: AC | PRN

## 2015-08-21 MED ORDER — CETIRIZINE HCL 10 MG PO TABS: 10.0000 mg | ORAL_TABLET | Freq: Every day | ORAL | Status: DC

## 2015-08-21 MED ORDER — FLUTICASONE PROPIONATE 50 MCG/ACT NA SUSP: 2.0000 | Freq: Every day | NASAL | Status: DC

## 2015-08-21 MED ORDER — CETIRIZINE-PSEUDOEPHEDRINE ER 5-120 MG PO TB12: 1.0000 | ORAL_TABLET | Freq: Two times a day (BID) | ORAL | Status: DC

## 2015-08-21 NOTE — Patient Instructions (Signed)
Hay Fever Hay fever is an allergic reaction to particles in the air. It cannot be passed from person to person. It cannot be cured, but it can be controlled. CAUSES  Hay fever is caused by something that triggers an allergic reaction (allergens). The following are examples of allergens:  Ragweed.  Feathers.  Animal dander.  Grass and tree pollens.  Cigarette smoke.  House dust.  Pollution. SYMPTOMS   Sneezing.  Runny or stuffy nose.  Tearing eyes.  Itchy eyes, nose, mouth, throat, skin, or other area.  Sore throat.  Headache.  Decreased sense of smell or taste. DIAGNOSIS Your caregiver will perform a physical exam and ask questions about the symptoms you are having.Allergy testing may be done to determine exactly what triggers your hay fever.  TREATMENT   Over-the-counter medicines may help symptoms. These include:  Antihistamines.  Decongestants. These may help with nasal congestion.  Your caregiver may prescribe medicines if over-the-counter medicines do not work.  Some people benefit from allergy shots when other medicines are not helpful. HOME CARE INSTRUCTIONS   Avoid the allergen that is causing your symptoms, if possible.  Take all medicine as told by your caregiver. SEEK MEDICAL CARE IF:   You have severe allergy symptoms and your current medicines are not helping.  Your treatment was working at one time, but you are now experiencing symptoms.  You have sinus congestion and pressure.  You develop a fever or headache.  You have thick nasal discharge.  You have asthma and have a worsening cough and wheezing. SEEK IMMEDIATE MEDICAL CARE IF:   You have swelling of your tongue or lips.  You have trouble breathing.  You feel lightheaded or like you are going to faint.  You have cold sweats.  You have a fever.   This information is not intended to replace advice given to you by your health care provider. Make sure you discuss any  questions you have with your health care provider.   Document Released: 05/07/2005 Document Revised: 07/30/2011 Document Reviewed: 11/17/2014 Elsevier Interactive Patient Education 2016 [employer]  

## 2015-08-21 NOTE — Progress Notes (Signed)
Subjective:    Patient ID: Benjamin Sharp, male    DOB: 04-16-1971, 45 y.o.   MRN: [id number] By signing my name below, I, Benjamin Sharp, attest that this documentation has been prepared under the direction and in the presence of Delman Cheadle, MD. Electronically Signed: Judithe Sharp, ER Scribe. 08/21/2015. 2:19 PM.  Chief Complaint  Patient presents with  . Cough    all SXs x 2 days  . Sinusitis  . Sore Throat  . Wheezing    HPI HPI Comments: [name] is a 45 y.o. male with a past hx of asthma who presents to [facility] complaining of two days of rhinorrhea, cough, HA, myalgias, chills, and cough productive of clear mucous. He has also had some wheezing at night when he is laying down for bed. The congestion has made it hard for him to sleep. He denies fever. He has not taken any OTC medications. He usually has seasonal allergies. He does not have an inhaler.    Past Medical History  Diagnosis Date  . Hypertension   . Diabetes mellitus without complication ([place])   . Hyperlipemia   . Impaired glucose tolerance   . GERD (gastroesophageal reflux disease)   . Anxiety   . Sleep apnea   . Obesity   . Kidney stone    No Known Allergies  Current Outpatient Prescriptions on File Prior to Visit  Medication Sig Dispense Refill  . ibuprofen (ADVIL,MOTRIN) 200 MG tablet Take 600 mg by mouth every 6 (six) hours as needed.    . metFORMIN (GLUCOPHAGE) 1000 MG tablet 1,000 mg 2 (two) times daily with a meal.     . pantoprazole (PROTONIX) 40 MG tablet 40 mg daily.      No current facility-administered medications on file prior to visit.    Review of Systems  Constitutional: Positive for chills and fatigue. Negative for fever.  HENT: Positive for congestion and rhinorrhea.   Respiratory: Positive for cough and wheezing. Negative for shortness of breath.   Musculoskeletal: Positive for myalgias.  Neurological: Positive for headaches.      Objective:  BP 134/86 mmHg  Pulse 118  Temp(Src)  98.2 F (36.8 C) (Oral)  Resp 98  Ht 5\' 7"  (1.702 m)  Wt 287 lb 3.2 oz (130.273 kg)  BMI 44.97 kg/m2  SpO2 98%  Physical Exam  Constitutional: He is oriented to person, place, and time. He appears well-developed and well-nourished. No distress.  Diaphoretic  HENT:  Head: Normocephalic and atraumatic.  Severe nasal erythema and edema with clear rhinitis. Erythema in oropharynx, positive postnasal drip.   Eyes: Pupils are equal, round, and reactive to light.  Neck: Neck supple.  Cardiovascular: Regular rhythm and normal heart sounds.   No murmur heard. Tachycardic  Pulmonary/Chest: Effort normal and breath sounds normal. No respiratory distress. He has no wheezes.  Lungs clear, good air movement.  Musculoskeletal: Normal range of motion.  Neurological: He is alert and oriented to person, place, and time. Coordination normal.  Skin: Skin is warm and dry. He is not diaphoretic.  Psychiatric: He has a normal mood and affect. His behavior is normal.  Nursing note and vitals reviewed.     Assessment & Plan:   1. Hay fever     Meds ordered this encounter  Medications  . cetirizine-pseudoephedrine (ZYRTEC-D) 5-120 MG tablet    Sig: Take 1 tablet by mouth 2 (two) times daily.    Dispense:  14 tablet    Refill:  1  .  fluticasone (FLONASE) 50 MCG/ACT nasal spray    Sig: Place 2 sprays into both nostrils at bedtime.    Dispense:  16 g    Refill:  2  . Codeine Polt-Chlorphen Polt ER (TUZISTRA XR) 14.7-2.8 MG/5ML SUER    Sig: Take 10 mLs by mouth every 12 (twelve) hours as needed (cough and congestion).    Dispense:  180 mL    Refill:  0  . cetirizine (ZYRTEC) 10 MG tablet    Sig: Take 1 tablet (10 mg total) by mouth at bedtime.    Dispense:  30 tablet    Refill:  11    I personally performed the services described in this documentation, which was scribed in my presence. The recorded information has been reviewed and considered, and addended by me as needed.  Delman Cheadle, MD  [facility]

## 2015-08-25 ENCOUNTER — Encounter: Payer: Self-pay | Admitting: Neurology

## 2015-08-25 ENCOUNTER — Ambulatory Visit (INDEPENDENT_AMBULATORY_CARE_PROVIDER_SITE_OTHER): Payer: 59 | Admitting: Neurology

## 2015-08-25 VITALS — BP 158/98 | HR 80 | Resp 18 | Ht 67.0 in | Wt 285.0 lb

## 2015-08-25 DIAGNOSIS — G4733 Obstructive sleep apnea (adult) (pediatric): Secondary | ICD-10-CM

## 2015-08-25 DIAGNOSIS — Z9989 Dependence on other enabling machines and devices: Secondary | ICD-10-CM

## 2015-08-25 NOTE — Patient Instructions (Addendum)
Please continue using your CPAP regularly. While your insurance requires that you use CPAP at least 4 hours each night on 70% of the nights, I recommend, that you not skip any nights and use it throughout the night if you can. Getting used to CPAP and staying with the treatment long term does take time and patience and discipline. Untreated obstructive sleep apnea when it is moderate to severe can have an adverse impact on cardiovascular health and raise her risk for heart disease, arrhythmias, hypertension, congestive heart failure, stroke and diabetes. Untreated obstructive sleep apnea causes sleep disruption, nonrestorative sleep, and sleep deprivation. This can have an impact on your day to day functioning and cause daytime sleepiness and impairment of cognitive function, memory loss, mood disturbance, and problems focussing. Using CPAP regularly can improve these symptoms. I will see you back in 6 months for sleep apnea check up.  Work on weight loss.

## 2015-08-25 NOTE — Progress Notes (Signed)
Subjective:    Patient ID: Benjamin Sharp is a 45 y.o. male.  HPI     Interim history:   Benjamin Sharp is a 45 year old right-handed gentleman with an underlying medical history of hyperlipidemia, type 2 diabetes, glaucoma, asthma,  reflux disease, anxiety, hypertension, and severe obesity, who presents for follow-up consultation of his OSA, on home CPAP therapy. The patient is unaccompanied today. I last saw him on 05/25/2015, at which time he was not using his CPAP. He had recently had elbow surgery on 04/22/2015. He had started physical therapy. He was off narcotic pain medication. Soon after his elbow surgery he had a kidney stone and with all of his recent medical issues he was not able to use CPAP he stated. He did indicate compliance in the beginning and that he felt better with CPAP therapy indicating that he felt better rested and less sleepy during the day and that overall his sleep quality had improved. He was motivated to get back on treatment. It was difficult to put the mask on because he had limited movement of his left arm secondary to recent surgery and residual pain.  Today, 08/25/2015: I reviewed his CPAP compliance data from 07/24/2015 through 08/22/2015 which is a total of 30 days during which time he used his machine 28 days with percent used days greater than 4 hours at 50%, indicating suboptimal compliance with an average usage for all days of 3 hours and 38 minutes, residual AHI low at 0.6 per hour, leak acceptable with the 95th percentile at 7.8 L/m on a pressure of 8 cm with EPR of 3. His recent 90 day compliance data shows 70% compliance.  Today, 08/25/2015: He reports having had issues with allergies recently. On 08/21/2015 he went to urgent care because of congestion, myalgias, sore throat, and symptomatic wheeze. He was prescribed allergy medication and cough medicine. He is feeling better. Sometimes he pulls off his CPAP in the middle of the night because of excess moisture and  does not realize that he has done so. He has reduced his humidity setting to 1. Overall, he feels better. He sleeps better. He feels better rested. No recent issues with his left elbow which has healed well. No recent issues with kidney stones either.  Previously:   After his recent sleep study. The patient is unaccompanied today. I first met him on 01/04/2015 at the request of his primary care physician, at which time the patient reported snoring, and excessive daytime somnolence. I invited him back for sleep study. He had a baseline sleep study, followed by a CPAP titration study. I went over his test results with him in detail today. His baseline sleep study from 02/03/2015 showed a sleep efficiency of 92.9% with a normal sleep latency of 13 minutes and wake after sleep onset of 16 minutes. He had a normal percentage of light stage sleep, a mildly increased percentage of slow-wave sleep and a normal percentage of REM sleep with a normal REM latency. He had no significant PLMS, EKG or EEG changes. He had moderate to loud snoring. Total AHI was 9 per hour, rising to 26.1 per hour during REM sleep. Average oxygen saturation was 95%, nadir was 85%. Based on his sleep related complaints I invited him back for a full night CPAP titration study. He had this on 03/01/2015. Sleep efficiency was 79.5% with a prolonged sleep latency of 32 minutes and wake after sleep onset of 63 minutes with mild sleep fragmentation noted. He had an increased  percentage of stage II sleep, absence of slow-wave sleep and a decreased percentage of REM sleep at 14.3% with a prolonged REM latency. He had no significant PLMS, EKG or EEG changes. Average oxygen saturation was 96%, nadir was 90%. CPAP was titrated from 5 cm to 8 cm. AHI was 0 per hour on that pressure with supine REM sleep achieved. Based on his test results are prescribed CPAP therapy for home use.   I reviewed his CPAP compliance data from 04/24/2015 through 05/23/2015  which is a total of 30 days during which time he used his machine only 3 days and has not used it since 04/29/2015. Residual AHI was 1.8, leak acceptable, percent used days greater than 4 hours was 0%, indicating noncompliance. Previously, from 04/14/2015 through 05/23/2015 which is a total of 40 days it looks like he used his CPAP for total of 12 days, indicating noncompliance.  01/04/2015: He reports snoring and excessive daytime somnolence. He lives with his mother, sister and niece and all of them have reported his loud snoring and also told him that he has pauses in his breathing. He has woken himself up with loud snoring or snorting. He denies morning headaches. He has nighttime leg cramps that often wake him up. He denies frank restless leg symptoms. He sleeps alone and a queen size bed. He works full-time as a Counsellor. He used to work nights for about 9 years. In the past 7 or 8 years he has worked daytime hours and works from 6:30 AM to 6:30 PM. Bedtime varies but is generally around 10 PM and he falls asleep quickly. His rise time is 5 AM he needs does not wake up rested. He is tired during the day. His Epworth sleepiness score is 12 out of 24, his fatigue score is 47 out of 63. He is not aware of any family history of obstructive sleep apnea. He has mild asthma which is usually under good control. He has a history of acid reflux but symptoms have been under control with Protonix. He has been diagnosed with glaucoma and has eyedrops for this. He goes once a year for his eye checkup. He drinks alcohol occasionally, about 4 beers a month. He drinks sodas occasionally, about 4-6 sodas a month. He is a nonsmoker. He is divorced and has no children.  His Past Medical History Is Significant For: Past Medical History  Diagnosis Date  . Hypertension   . Diabetes mellitus without complication ([place])   . Hyperlipemia   . Impaired glucose tolerance   . GERD (gastroesophageal reflux disease)   . Anxiety    . Sleep apnea   . Obesity   . Kidney stone     His Past Surgical History Is Significant For: Past Surgical History  Procedure Laterality Date  . Hand surgery    . Gall stones-2015    . Elbow surgery Left 2016    His Family History Is Significant For: Family History  Problem Relation Age of Onset  . Diabetes Mother   . Diabetes Father   . Cancer Father   . Diabetes Sister     His Social History Is Significant For: Social History   Social History  . Marital Status: Single    Spouse Name: N/A  . Number of Children: 0  . Years of Education: 12   Occupational History  . Dispatcher     Social History Main Topics  . Smoking status: Never Smoker   . Smokeless tobacco: None  .  Alcohol Use: 0.0 oz/week    0 Standard drinks or equivalent per week     Comment: rarely  . Drug Use: No  . Sexual Activity: Not Asked   Other Topics Concern  . None   Social History Narrative   Drinks about 4-6 sodas a month    His Allergies Are:  No Known Allergies:   His Current Medications Are:  Outpatient Encounter Prescriptions as of 08/25/2015  Medication Sig  . cetirizine (ZYRTEC) 10 MG tablet Take 1 tablet (10 mg total) by mouth at bedtime.  . cetirizine-pseudoephedrine (ZYRTEC-D) 5-120 MG tablet Take 1 tablet by mouth 2 (two) times daily.  . Codeine Polt-Chlorphen Polt ER (TUZISTRA XR) 14.7-2.8 MG/5ML SUER Take 10 mLs by mouth every 12 (twelve) hours as needed (cough and congestion).  . fluticasone (FLONASE) 50 MCG/ACT nasal spray Place 2 sprays into both nostrils at bedtime.  Marland Kitchen ibuprofen (ADVIL,MOTRIN) 200 MG tablet Take 600 mg by mouth every 6 (six) hours as needed.  . metFORMIN (GLUCOPHAGE) 1000 MG tablet 1,000 mg 2 (two) times daily with a meal.   . pantoprazole (PROTONIX) 40 MG tablet 40 mg daily.    No facility-administered encounter medications on file as of 08/25/2015.  :  Review of Systems:  Out of a complete 14 point review of systems, all are reviewed and negative  with the exception of these symptoms as listed below:   Review of Systems  Neurological:       Patient states that he is doing well on CPAP. He feels better when using it. Unable to use for past couple of days due to allergies. Patient is curious about trying nasal pillows.     Objective:  Neurologic Exam  Physical Exam Physical Examination:   Filed Vitals:   08/25/15 0937  BP: 158/98  Pulse: 80  Resp: 18    General Examination: The patient is a very pleasant 45 y.o. male in no acute distress. He appears well-developed and well-nourished and well groomed. He is in good spirits today.   HEENT: Normocephalic, atraumatic, pupils are equal, round and reactive to light and accommodation. Extraocular tracking is good without limitation to gaze excursion or nystagmus noted. Normal smooth pursuit is noted. Hearing is grossly intact. Face is symmetric with normal facial animation and normal facial sensation. Speech is clear with no dysarthria noted. There is no hypophonia. There is no lip, neck/head, jaw or voice tremor. Neck is supple with full range of passive and active motion. There are no carotid bruits on auscultation. Oropharynx exam reveals: mild mouth dryness, Mild pharyngeal erythema, good dental hygiene and moderate airway crowding, due to thicker soft palate, larger tongue and tonsils in place, 1+ bilaterally. He does appear to have a short neck. Mallampati is class I. tongue protrudes centrally and palate elevates symmetrically.  He has a mild overbite.  Chest: Clear to auscultation without wheezing, rhonchi or crackles noted.  Heart: S1+S2+0, regular and normal without murmurs, rubs or gallops noted.   Abdomen: Soft, non-tender and non-distended with normal bowel sounds appreciated on auscultation.  Extremities: There is no pitting edema in the distal lower extremities bilaterally. His left elbow has a very inconspicuous scar and good range of motion.   Skin: Warm and dry without  trophic changes noted. There are no varicose veins.  Musculoskeletal: exam reveals no obvious joint deformities, tenderness or joint swelling or erythema.   Neurologically:  Mental status: The patient is awake, alert and oriented in all 4 spheres. His immediate and  remote memory, attention, language skills and fund of knowledge are appropriate. There is no evidence of aphasia, agnosia, apraxia or anomia. Speech is clear with normal prosody and enunciation. Thought process is linear. Mood is normal and affect is normal.  Cranial nerves II - XII are as described above under HEENT exam. In addition: shoulder shrug is normal with equal shoulder height noted. Motor exam: Normal bulk, strength and tone is noted. There is no drift, tremor or rebound. Romberg is negative. Reflexes are 2+ throughout. Fine motor skills and coordination: intact.  Cerebellar testing: No dysmetria or intention tremor on finger to nose testing. Heel to shin is unremarkable bilaterally. There is no truncal or gait ataxia.  Sensory exam: intact to light in the upper and lower extremities.  Gait, station and balance: He stands easily. No veering to one side is noted. No leaning to one side is noted. Posture is age-appropriate and stance is narrow based. Gait shows normal stride length and normal pace. No problems turning are noted. Tandem walk is unremarkable.   Assessment and Plan:  In summary, Burnell Hurta is a very pleasant 45 year old male with an underlying medical history of hyperlipidemia, type 2 diabetes, glaucoma, asthma,  reflux disease, anxiety, hypertension, and severe obesity, who presents for follow-up consultation of his mild to moderate obstructive sleep apneaNow established on CPAP therapy at a pressure of 8 cm with good results and good compliance. He had some setbacks 3 months ago but has improved his compliance and feels better. He had kidney stone issues, he needed left elbow surgery. More recently in the past few  weeks he had more allergy symptoms and congestion. He is now on allergy medicine and nasal spray, and feels improved, tried cough syrup but is not currently taking it. He is advised to try to get his CPAP compliance above 70%, when he realizes that he has pulled his mask off in the middle of the night he is encouraged to try to put her back on when he can. Overall, he feels improved in his sleep. Weight has been fluctuating, the highest he has been in our system that I can see was 300 pounds. He is strongly encouraged to try to work on weight loss. He is advised to stay well-hydrated and fully compliant with CPAP therapy. At this juncture, I would consider him back in 6 months, sooner if needed. He would like to try nasal pillows and I prescribed new supplies for him as well. He was complaining of excess moisture around the nasal mask. With pillows it may be better. Nevertheless, since we are entering the more humid months, he may be able to get away without added humidity to his CPAP therapy. I answered all his questions today and the patient was in agreement.  I spent 25 minutes in total face-to-face time with the patient, more than 50% of which was spent in counseling and coordination of care, reviewing test results, reviewing medication and discussing or reviewing the diagnosis of OSA, its prognosis and treatment options.

## 2015-11-24 ENCOUNTER — Ambulatory Visit (HOSPITAL_COMMUNITY)
Admission: EM | Admit: 2015-11-24 | Discharge: 2015-11-24 | Disposition: A | Discharge status: Home / Self Care | Payer: 59 | Attending: Emergency Medicine | Admitting: Emergency Medicine

## 2015-11-24 ENCOUNTER — Encounter (HOSPITAL_COMMUNITY): Payer: Self-pay | Admitting: Emergency Medicine

## 2015-11-24 DIAGNOSIS — E785 Hyperlipidemia, unspecified: Secondary | ICD-10-CM | POA: Insufficient documentation

## 2015-11-24 DIAGNOSIS — F419 Anxiety disorder, unspecified: Secondary | ICD-10-CM | POA: Insufficient documentation

## 2015-11-24 DIAGNOSIS — I10 Essential (primary) hypertension: Secondary | ICD-10-CM | POA: Diagnosis not present

## 2015-11-24 DIAGNOSIS — G4730 Sleep apnea, unspecified: Secondary | ICD-10-CM | POA: Insufficient documentation

## 2015-11-24 DIAGNOSIS — Z79899 Other long term (current) drug therapy: Secondary | ICD-10-CM | POA: Insufficient documentation

## 2015-11-24 DIAGNOSIS — K219 Gastro-esophageal reflux disease without esophagitis: Secondary | ICD-10-CM | POA: Insufficient documentation

## 2015-11-24 DIAGNOSIS — E119 Type 2 diabetes mellitus without complications: Secondary | ICD-10-CM | POA: Diagnosis not present

## 2015-11-24 DIAGNOSIS — Z7984 Long term (current) use of oral hypoglycemic drugs: Secondary | ICD-10-CM | POA: Insufficient documentation

## 2015-11-24 DIAGNOSIS — J069 Acute upper respiratory infection, unspecified: Secondary | ICD-10-CM | POA: Diagnosis not present

## 2015-11-24 LAB — POCT RAPID STREP A: STREPTOCOCCUS, GROUP A SCREEN (DIRECT): NEGATIVE

## 2015-11-24 MED ORDER — AMOXICILLIN 500 MG PO CAPS: 500.0000 mg | ORAL_CAPSULE | Freq: Three times a day (TID) | ORAL | Status: DC

## 2015-11-24 MED ORDER — ALBUTEROL SULFATE HFA 108 (90 BASE) MCG/ACT IN AERS: 2.0000 | INHALATION_SPRAY | RESPIRATORY_TRACT | Status: AC | PRN

## 2015-11-24 MED ORDER — ALBUTEROL SULFATE HFA 108 (90 BASE) MCG/ACT IN AERS: 1.0000 | INHALATION_SPRAY | Freq: Four times a day (QID) | RESPIRATORY_TRACT | Status: AC | PRN

## 2015-11-24 NOTE — Discharge Instructions (Signed)
Upper Respiratory Infection, Adult Most upper respiratory infections (URIs) are caused by a virus. A URI affects the nose, throat, and upper air passages. The most common type of URI is often called "the common cold." HOME CARE   Take medicines only as told by your doctor.  Gargle warm saltwater or take cough drops to comfort your throat as told by your doctor.  Use a warm mist humidifier or inhale steam from a shower to increase air moisture. This may make it easier to breathe.  Drink enough fluid to keep your pee (urine) clear or pale yellow.  Eat soups and other clear broths.  Have a healthy diet.  Rest as needed.  Go back to work when your fever is gone or your doctor says it is okay.  You may need to stay home longer to avoid giving your URI to others.  You can also wear a face mask and wash your hands often to prevent spread of the virus.  Use your inhaler more if you have asthma.  Do not use any tobacco products, including cigarettes, chewing tobacco, or electronic cigarettes. If you need help quitting, ask your doctor. GET HELP IF:  You are getting worse, not better.  Your symptoms are not helped by medicine.  You have chills.  You are getting more short of breath.  You have brown or red mucus.  You have yellow or brown discharge from your nose.  You have pain in your face, especially when you bend forward.  You have a fever.  You have puffy (swollen) neck glands.  You have pain while swallowing.  You have white areas in the back of your throat. GET HELP RIGHT AWAY IF:   You have very bad or constant:  Headache.  Ear pain.  Pain in your forehead, behind your eyes, and over your cheekbones (sinus pain).  Chest pain.  You have long-lasting (chronic) lung disease and any of the following:  Wheezing.  Long-lasting cough.  Coughing up blood.  A change in your usual mucus.  You have a stiff neck.  You have changes in  your:  Vision.  Hearing.  Thinking.  Mood. MAKE SURE YOU:   Understand these instructions.  Will watch your condition.  Will get help right away if you are not doing well or get worse.   This information is not intended to replace advice given to you by your health care provider. Make sure you discuss any questions you have with your health care provider.   Document Released: 10/24/2007 Document Revised: 09/21/2014 Document Reviewed: 08/12/2013 Elsevier Interactive Patient Education 2016 Else[employer]  Cough, Adult A cough helps to clear your throat and lungs. A cough may last only 2-3 weeks (acute), or it may last longer than 8 weeks (chronic). Many different things can cause a cough. A cough may be a sign of an illness or another medical condition. HOME CARE  Pay attention to any changes in your cough.  Take medicines only as told by your doctor.  If you were prescribed an antibiotic medicine, take it as told by your doctor. Do not stop taking it even if you start to feel better.  Talk with your doctor before you try using a cough medicine.  Drink enough fluid to keep your pee (urine) clear or pale yellow.  If the air is dry, use a cold steam vaporizer or humidifier in your home.  Stay away from things that make you cough at work or at home.  If your cough is worse at night, try using extra pillows to raise your head up higher while you sleep.  Do not smoke, and try not to be around smoke. If you need help quitting, ask your doctor.  Do not have caffeine.  Do not drink alcohol.  Rest as needed. GET HELP IF:  You have new problems (symptoms).  You cough up yellow fluid (pus).  Your cough does not get better after 2-3 weeks, or your cough gets worse.  Medicine does not help your cough and you are not sleeping well.  You have pain that gets worse or pain that is not helped with medicine.  You have a fever.  You are losing weight and you do not know  why.  You have night sweats. GET HELP RIGHT AWAY IF:  You cough up blood.  You have trouble breathing.  Your heartbeat is very fast.   This information is not intended to replace advice given to you by your health care provider. Make sure you discuss any questions you have with your health care provider.   Document Released: 01/18/2011 Document Revised: 01/26/2015 Document Reviewed: 07/14/2014 Elsevier Interactive Patient Education 2016 Else[employer] Inc[employer] Bronchospasm, Adult A bronchospasm is when the tubes that carry air in and out of your lungs (airways) spasm or tighten. During a bronchospasm it is hard to breathe. This is because the airways get smaller. A bronchospasm can be triggered by:  Allergies. These may be to animals, pollen, food, or mold.  Infection. This is a common cause of bronchospasm.  Exercise.  Irritants. These include pollution, cigarette smoke, strong odors, aerosol sprays, and paint fumes.  Weather changes.  Stress.  Being emotional. HOME CARE   Always have a plan for getting help. Know when to call your doctor and local emergency services (911 in the [place] Know where you can get emergency care.  Only take medicines as told by your doctor.  If you were prescribed an inhaler or nebulizer machine, ask your doctor how to use it correctly. Always use a spacer with your inhaler if you were given one.  Stay calm during an attack. Try to relax and breathe more slowly.  Control your home environment:  Change your heating and air conditioning filter at least once a month.  Limit your use of fireplaces and wood stoves.  Do not  smoke. Do not  allow smoking in your home.  Avoid perfumes and fragrances.  Get rid of pests (such as roaches and mice) and their droppings.  Throw away plants if you see mold on them.  Keep your house clean and dust free.  Replace carpet with wood, tile, or vinyl flooring. Carpet can trap dander and dust.  Use  allergy-proof pillows, mattress covers, and box spring covers.  Wash bed sheets and blankets every week in hot water. Dry them in a dryer.  Use blankets that are made of polyester or cotton.  Wash hands frequently. GET HELP IF:  You have muscle aches.  You have chest pain.  The thick spit you spit or cough up (sputum) changes from clear or white to yellow, green, gray, or bloody.  The thick spit you spit or cough up gets thicker.  There are problems that may be related to the medicine you are given such as:  A rash.  Itching.  Swelling.  Trouble breathing. GET HELP RIGHT AWAY IF:  You feel you cannot breathe or catch your breath.  You cannot stop coughing.  Your treatment  is not helping you breathe better.  You have very bad chest pain. MAKE SURE YOU:   Understand these instructions.  Will watch your condition.  Will get help right away if you are not doing well or get worse.   This information is not intended to replace advice given to you by your health care provider. Make sure you discuss any questions you have with your health care provider.   Document Released: 03/04/2009 Document Revised: 05/28/2014 Document Reviewed: 10/28/2012 Elsevier Interactive Patient Education [employer].

## 2015-11-24 NOTE — ED Notes (Signed)
Patient requested medicines be sent to [email or web address] [place] [facility]

## 2015-11-24 NOTE — ED Provider Notes (Signed)
CSN: [id number]     Arrival date & time 11/24/15  1918 History   First MD Initiated Contact with Patient 11/24/15 2028     Chief Complaint  Patient presents with  . URI   (Consider location/radiation/quality/duration/timing/severity/associated sxs/prior Treatment) HPI  History obtained from patient:  Pt presents with the cc of:  Cough and cold congestion Duration of symptoms: 2 weeks Treatment prior to arrival: Recent prescription for antibiotics Better for short period of time and worse again. Context: The patient had been on a cruise came back at all was seen by his primary care provider. States he now has wheezes and producing a greenish colored sputum. Other symptoms include: Sputum, sore throat Pain score: 0 FAMILY HISTORY: Hypertension     Past Medical History  Diagnosis Date  . Hypertension   . Diabetes mellitus without complication ([place])   . Hyperlipemia   . Impaired glucose tolerance   . GERD (gastroesophageal reflux disease)   . Anxiety   . Sleep apnea   . Obesity   . Kidney stone    Past Surgical History  Procedure Laterality Date  . Hand surgery    . Gall stones-2015    . Elbow surgery Left 2016   Family History  Problem Relation Age of Onset  . Diabetes Mother   . Diabetes Father   . Cancer Father   . Diabetes Sister    Social History  Substance Use Topics  . Smoking status: Never Smoker   . Smokeless tobacco: None  . Alcohol Use: 0.0 oz/week    0 Standard drinks or equivalent per week     Comment: rarely    Review of Systems  Denies: HEADACHE, NAUSEA, ABDOMINAL PAIN, CHEST PAIN, CONGESTION, DYSURIA, SHORTNESS OF BREATH  Allergies  Review of patient's allergies indicates no known allergies.  Home Medications   Prior to Admission medications   Medication Sig Start Date End Date Taking? Authorizing Provider  albuterol (PROVENTIL HFA;VENTOLIN HFA) 108 (90 Base) MCG/ACT inhaler Inhale 1-2 puffs into the lungs every 6 (six) hours as needed for  wheezing or shortness of breath. 11/24/15   Konrad Felix, PA  amoxicillin (AMOXIL) 500 MG capsule Take 1 capsule (500 mg total) by mouth 3 (three) times daily. 11/24/15   Konrad Felix, PA  cetirizine (ZYRTEC) 10 MG tablet Take 1 tablet (10 mg total) by mouth at bedtime. 08/21/15   Shawnee Knapp, MD  cetirizine-pseudoephedrine (ZYRTEC-D) 5-120 MG tablet Take 1 tablet by mouth 2 (two) times daily. 08/21/15   Shawnee Knapp, MD  Codeine Polt-Chlorphen Polt ER (TUZISTRA XR) 14.7-2.8 MG/5ML SUER Take 10 mLs by mouth every 12 (twelve) hours as needed (cough and congestion). 08/21/15   Shawnee Knapp, MD  fluticasone (FLONASE) 50 MCG/ACT nasal spray Place 2 sprays into both nostrils at bedtime. 08/21/15   Shawnee Knapp, MD  ibuprofen (ADVIL,MOTRIN) 200 MG tablet Take 600 mg by mouth every 6 (six) hours as needed.    Historical Provider, MD  metFORMIN (GLUCOPHAGE) 1000 MG tablet 1,000 mg 2 (two) times daily with a meal.  12/28/14   Historical Provider, MD  pantoprazole (PROTONIX) 40 MG tablet 40 mg daily.  12/10/14   Historical Provider, MD   Meds Ordered and Administered this Visit  Medications - No data to display  BP 143/88 mmHg  Temp(Src) 99.1 F (37.3 C) (Oral)  Resp 16  SpO2 98% No data found.   Physical Exam NURSES NOTES AND VITAL SIGNS REVIEWED. CONSTITUTIONAL: Well developed, well  nourished, no acute distress HEENT: normocephalic, atraumatic EYES: Conjunctiva normal NECK:normal ROM, supple, no adenopathy PULMONARY:No respiratory distress, normal effort, few wheezes, no consolidation ABDOMINAL: Soft, ND, NT BS+, No CVAT MUSCULOSKELETAL: Normal ROM of all extremities,  SKIN: warm and dry without rash PSYCHIATRIC: Mood and affect, behavior are normal  ED Course  Procedures (including critical care time)  Labs Review Labs Reviewed  POCT RAPID STREP A    Imaging Review No results found.   Visual Acuity Review  Right Eye Distance:   Left Eye Distance:   Bilateral Distance:    Right Eye Near:    Left Eye Near:    Bilateral Near:         MDM   1. Acute URI     Patient is reassured that there are no issues that require transfer to higher level of care at this time or additional tests. Patient is advised to continue home symptomatic treatment. Patient is advised that if there are new or worsening symptoms to attend the emergency department, contact primary care provider, or return to UC. Instructions of care provided discharged home in stable condition.    THIS NOTE WAS GENERATED USING A VOICE RECOGNITION SOFTWARE PROGRAM. ALL REASONABLE EFFORTS  WERE MADE TO PROOFREAD THIS DOCUMENT FOR ACCURACY.  I have verbally reviewed the discharge instructions with the patient. A printed AVS was given to the patient.  All questions were answered prior to discharge.      Konrad Felix, [place] 11/24/15 2048

## 2015-11-24 NOTE — ED Notes (Signed)
Patient complains of sore throat, headache, wheezing, and cough.  Symptoms for 3 weeks.

## 2015-11-27 LAB — CULTURE, GROUP A STREP (THRC)

## 2016-02-23 ENCOUNTER — Ambulatory Visit: Payer: 59 | Admitting: Neurology

## 2016-05-30 DIAGNOSIS — B349 Viral infection, unspecified: Secondary | ICD-10-CM | POA: Diagnosis not present

## 2016-05-30 DIAGNOSIS — J45909 Unspecified asthma, uncomplicated: Secondary | ICD-10-CM | POA: Diagnosis not present

## 2016-06-04 DIAGNOSIS — D171 Benign lipomatous neoplasm of skin and subcutaneous tissue of trunk: Secondary | ICD-10-CM | POA: Diagnosis not present

## 2016-06-04 DIAGNOSIS — L918 Other hypertrophic disorders of the skin: Secondary | ICD-10-CM | POA: Diagnosis not present

## 2016-06-04 DIAGNOSIS — D2372 Other benign neoplasm of skin of left lower limb, including hip: Secondary | ICD-10-CM | POA: Diagnosis not present

## 2016-06-13 DIAGNOSIS — H401111 Primary open-angle glaucoma, right eye, mild stage: Secondary | ICD-10-CM | POA: Diagnosis not present

## 2016-07-11 DIAGNOSIS — H401122 Primary open-angle glaucoma, left eye, moderate stage: Secondary | ICD-10-CM | POA: Diagnosis not present

## 2016-08-03 DIAGNOSIS — H6982 Other specified disorders of Eustachian tube, left ear: Secondary | ICD-10-CM | POA: Diagnosis not present

## 2016-08-13 DIAGNOSIS — Z125 Encounter for screening for malignant neoplasm of prostate: Secondary | ICD-10-CM | POA: Diagnosis not present

## 2016-08-13 DIAGNOSIS — Z0000 Encounter for general adult medical examination without abnormal findings: Secondary | ICD-10-CM | POA: Diagnosis not present

## 2016-08-15 IMAGING — US US RENAL
1 series · 14 of 25 positions shown · non-contrast
Comparison: 02/12/2014

CLINICAL DATA: Right flank pain.

EXAM:
RENAL/URINARY TRACT ULTRASOUND COMPLETE

[Series 1: us renal · 0.26mm/px · 14 of 25 slices shown]
[im 1/25]
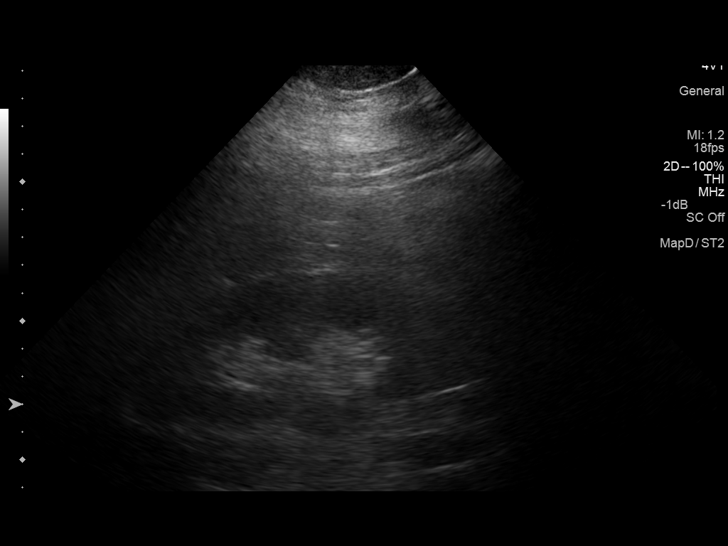
[im 3/25]
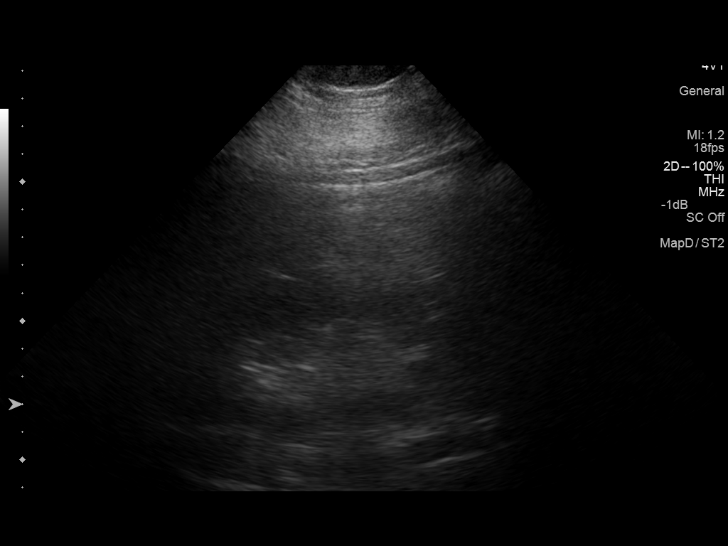
[im 5/25]
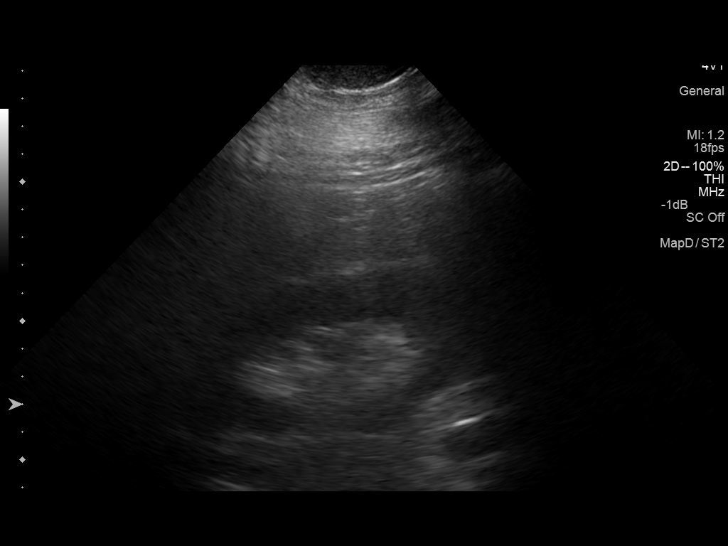
[im 7/25]
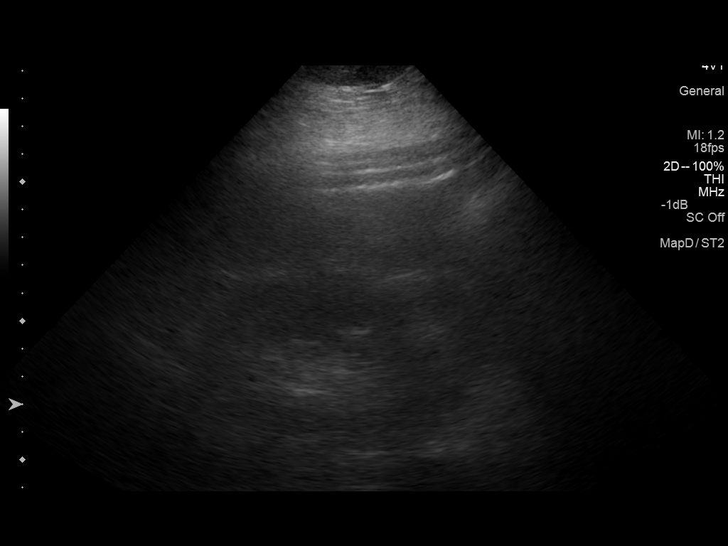
[im 9/25]
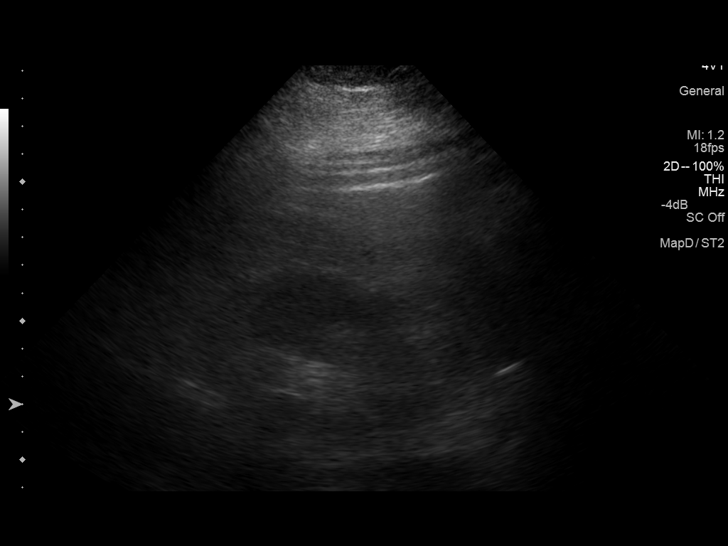
[im 10/25]
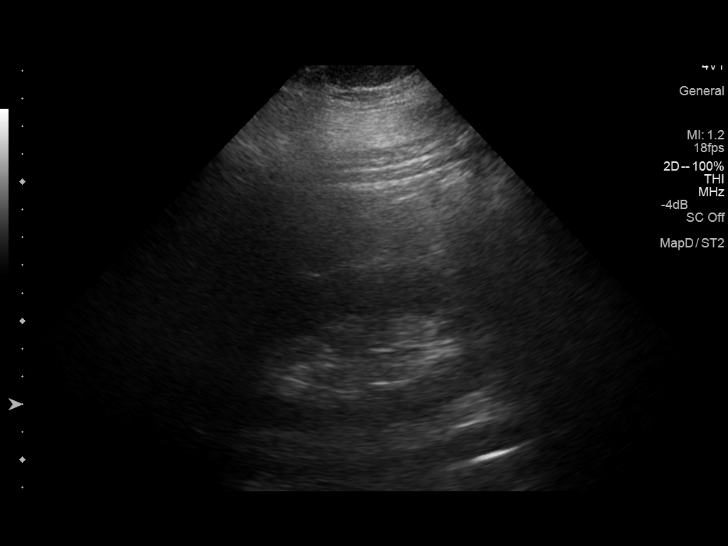
[im 12/25]
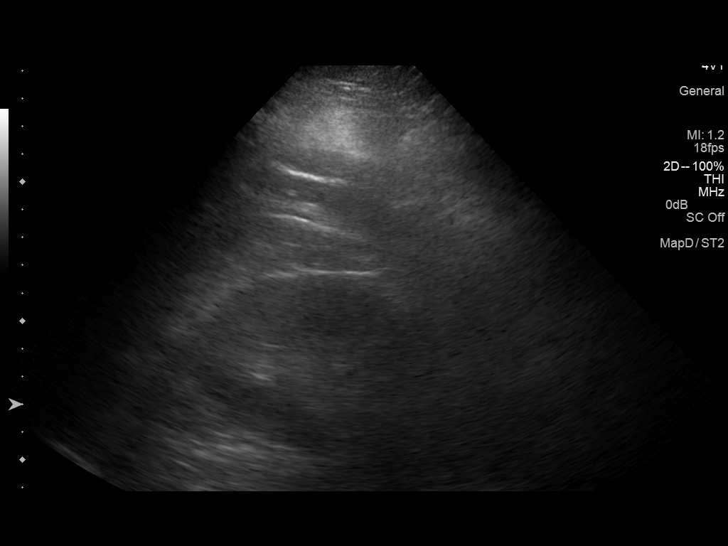
[im 14/25]
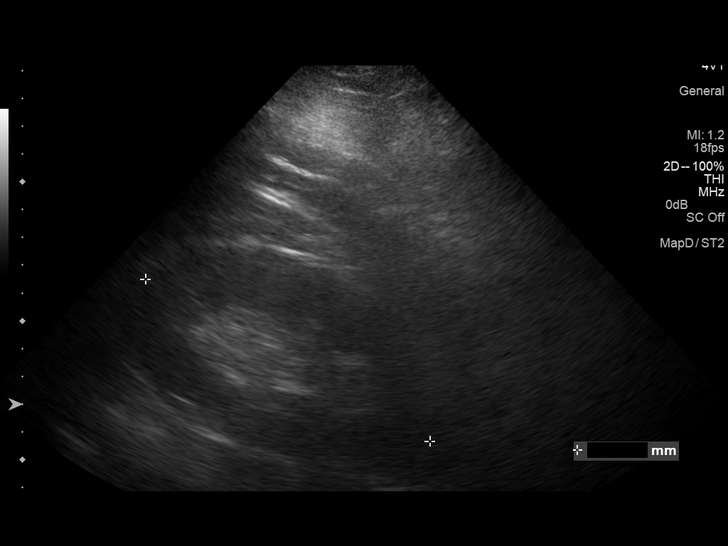
[im 16/25]
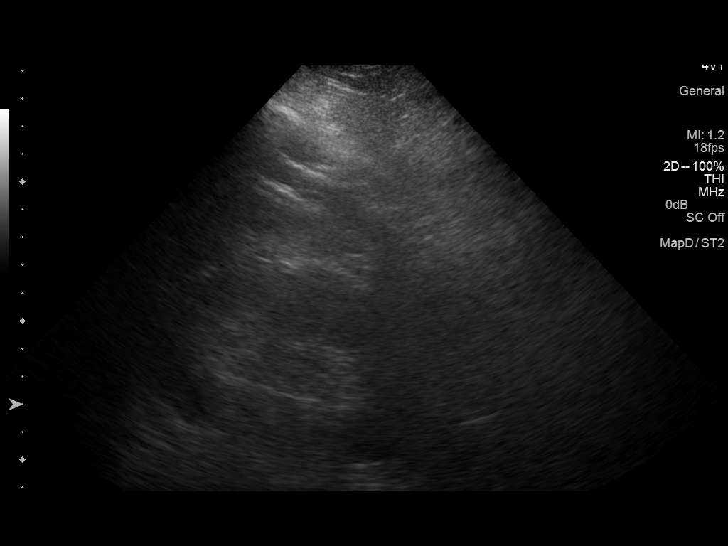
[im 17/25]
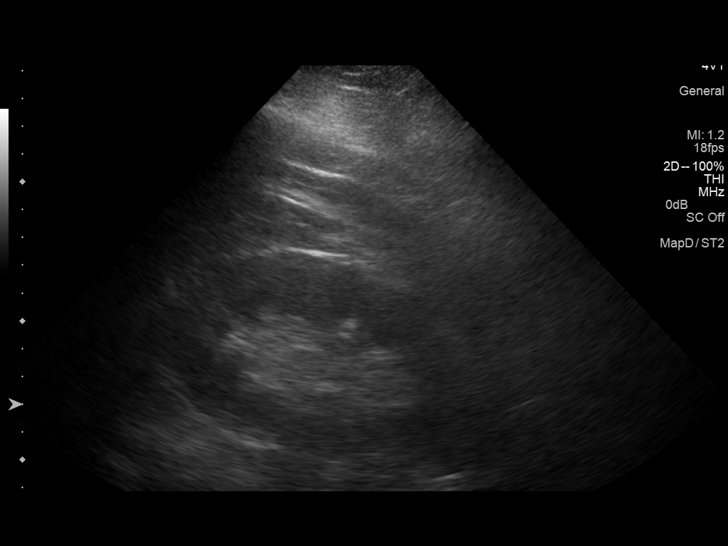
[im 19/25]
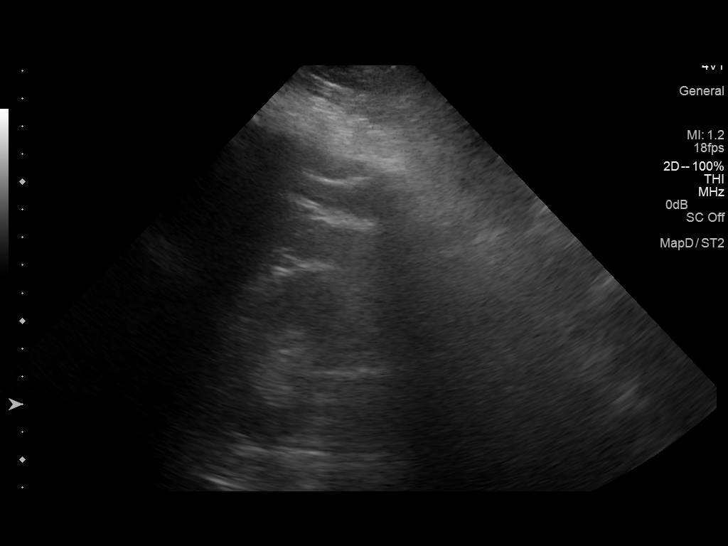
[im 21/25]
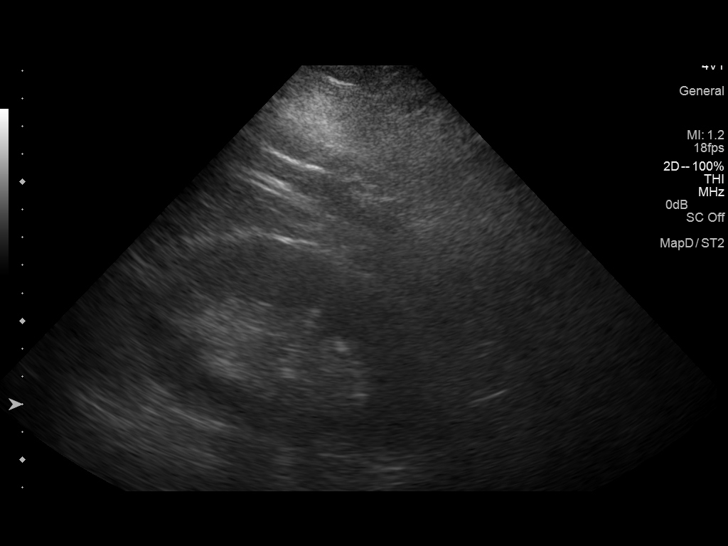
[im 23/25]
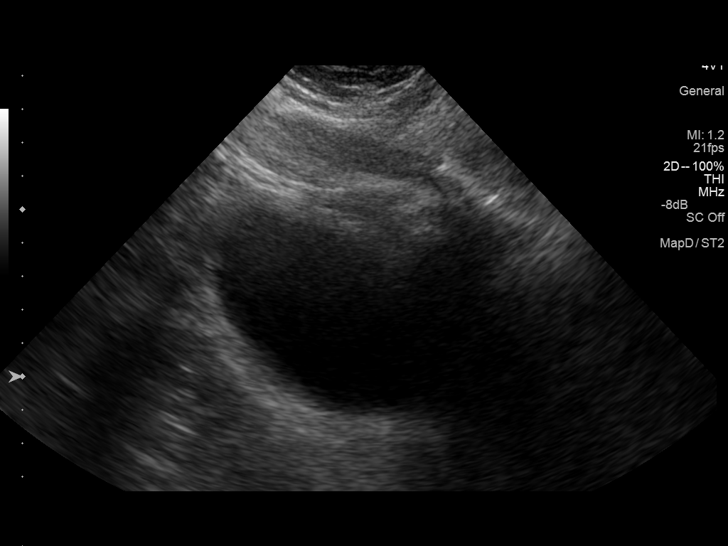
[im 25/25]
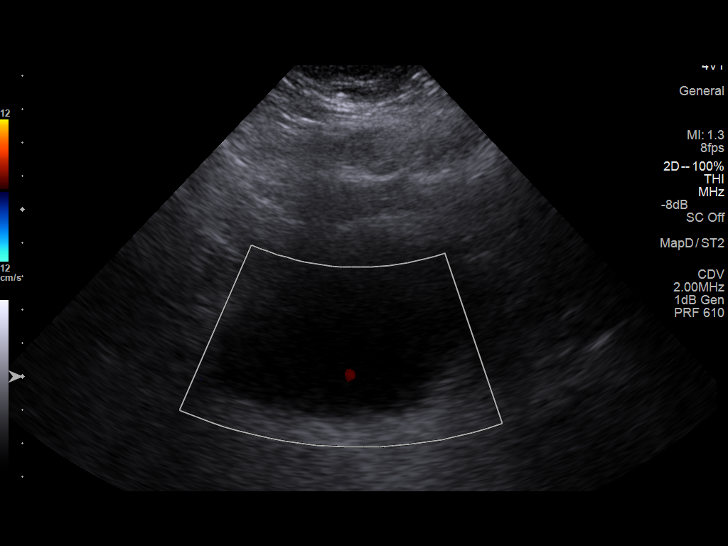

[14 of 25 positions shown; findings below may reference images not displayed]

FINDINGS: Right Kidney:

Length: 12 cm. Echogenicity within normal limits. No mass or
hydronephrosis visualized.

Left Kidney:

Length: 12 cm. Echogenicity within normal limits. No mass or
hydronephrosis visualized.

Bladder:

Appears normal for degree of bladder distention. Bilateral ureteral
jets noted.
IMPRESSION: Negative renal ultrasound.

## 2016-08-22 DIAGNOSIS — Z1389 Encounter for screening for other disorder: Secondary | ICD-10-CM | POA: Diagnosis not present

## 2016-08-22 DIAGNOSIS — Z0000 Encounter for general adult medical examination without abnormal findings: Secondary | ICD-10-CM | POA: Diagnosis not present

## 2016-08-22 DIAGNOSIS — E119 Type 2 diabetes mellitus without complications: Secondary | ICD-10-CM | POA: Diagnosis not present

## 2016-08-23 DIAGNOSIS — Z1212 Encounter for screening for malignant neoplasm of rectum: Secondary | ICD-10-CM | POA: Diagnosis not present

## 2016-08-29 DIAGNOSIS — E119 Type 2 diabetes mellitus without complications: Secondary | ICD-10-CM | POA: Diagnosis not present

## 2016-08-29 DIAGNOSIS — R030 Elevated blood-pressure reading, without diagnosis of hypertension: Secondary | ICD-10-CM | POA: Diagnosis not present

## 2016-08-31 DIAGNOSIS — H401111 Primary open-angle glaucoma, right eye, mild stage: Secondary | ICD-10-CM | POA: Diagnosis not present

## 2016-08-31 DIAGNOSIS — H401122 Primary open-angle glaucoma, left eye, moderate stage: Secondary | ICD-10-CM | POA: Diagnosis not present

## 2016-09-10 DIAGNOSIS — Z1212 Encounter for screening for malignant neoplasm of rectum: Secondary | ICD-10-CM | POA: Diagnosis not present

## 2016-10-09 DIAGNOSIS — E119 Type 2 diabetes mellitus without complications: Secondary | ICD-10-CM | POA: Diagnosis not present

## 2016-10-09 DIAGNOSIS — R030 Elevated blood-pressure reading, without diagnosis of hypertension: Secondary | ICD-10-CM | POA: Diagnosis not present

## 2017-01-09 DIAGNOSIS — L309 Dermatitis, unspecified: Secondary | ICD-10-CM | POA: Diagnosis not present

## 2017-01-30 DIAGNOSIS — R030 Elevated blood-pressure reading, without diagnosis of hypertension: Secondary | ICD-10-CM | POA: Diagnosis not present

## 2017-01-30 DIAGNOSIS — E119 Type 2 diabetes mellitus without complications: Secondary | ICD-10-CM | POA: Diagnosis not present

## 2017-01-30 DIAGNOSIS — E784 Other hyperlipidemia: Secondary | ICD-10-CM | POA: Diagnosis not present

## 2017-03-12 DIAGNOSIS — H401111 Primary open-angle glaucoma, right eye, mild stage: Secondary | ICD-10-CM | POA: Diagnosis not present

## 2017-03-12 DIAGNOSIS — H401122 Primary open-angle glaucoma, left eye, moderate stage: Secondary | ICD-10-CM | POA: Diagnosis not present

## 2017-03-12 DIAGNOSIS — E119 Type 2 diabetes mellitus without complications: Secondary | ICD-10-CM | POA: Diagnosis not present

## 2017-04-03 DIAGNOSIS — E119 Type 2 diabetes mellitus without complications: Secondary | ICD-10-CM | POA: Diagnosis not present

## 2017-04-03 DIAGNOSIS — E7849 Other hyperlipidemia: Secondary | ICD-10-CM | POA: Diagnosis not present

## 2017-04-04 DIAGNOSIS — M2021 Hallux rigidus, right foot: Secondary | ICD-10-CM | POA: Diagnosis not present

## 2017-06-03 DIAGNOSIS — G4730 Sleep apnea, unspecified: Secondary | ICD-10-CM | POA: Diagnosis not present

## 2017-06-03 DIAGNOSIS — E119 Type 2 diabetes mellitus without complications: Secondary | ICD-10-CM | POA: Diagnosis not present

## 2017-06-03 DIAGNOSIS — E7849 Other hyperlipidemia: Secondary | ICD-10-CM | POA: Diagnosis not present

## 2017-07-28 ENCOUNTER — Ambulatory Visit (HOSPITAL_COMMUNITY)
Admission: EM | Admit: 2017-07-28 | Discharge: 2017-07-28 | Disposition: A | Discharge status: Home / Self Care | Payer: 59 | Attending: Internal Medicine | Admitting: Internal Medicine

## 2017-07-28 ENCOUNTER — Encounter (HOSPITAL_COMMUNITY): Payer: Self-pay | Admitting: Emergency Medicine

## 2017-07-28 DIAGNOSIS — H9202 Otalgia, left ear: Secondary | ICD-10-CM | POA: Diagnosis not present

## 2017-07-28 MED ORDER — IPRATROPIUM BROMIDE 0.06 % NA SOLN: 2.0000 | Freq: Four times a day (QID) | NASAL | 0 refills | Status: AC

## 2017-07-28 MED ORDER — CETIRIZINE-PSEUDOEPHEDRINE ER 5-120 MG PO TB12: 1.0000 | ORAL_TABLET | Freq: Every day | ORAL | 0 refills | Status: AC

## 2017-07-28 MED ORDER — FLUTICASONE PROPIONATE 50 MCG/ACT NA SUSP: 2.0000 | Freq: Every day | NASAL | 0 refills | Status: AC

## 2017-07-28 NOTE — ED Triage Notes (Signed)
Pt sts left sided earache x several weeks

## 2017-07-28 NOTE — ED Provider Notes (Signed)
[name]    CSN: [id number] Arrival date & time: 07/28/17  [place]     History   Chief Complaint Chief Complaint  Patient presents with  . Otalgia    HPI Benjamin Sharp is a 47 y.o. male.   47 year old male comes in for a few week history of left ear pain.  States has happened in the past and resolved on its own.  Now more constant and came in for evaluation.  Denies consistent URI symptoms such as cough, sore throat, fevers.  Patient states has mild rhinorrhea each morning that resolves throughout the day.  Denies eye drainage.  Does have slight decrease in hearing in the left ear.  Does not use cotton swabs.  Denies recent swimming, travels.  Has not tried anything for the symptoms.      Past Medical History:  Diagnosis Date  . Anxiety   . Diabetes mellitus without complication ([place])   . GERD (gastroesophageal reflux disease)   . Hyperlipemia   . Hypertension   . Impaired glucose tolerance   . Kidney stone   . Obesity   . Sleep apnea     There are no active problems to display for this patient.   Past Surgical History:  Procedure Laterality Date  . ELBOW SURGERY Left 2016  . gall stones-2015    . HAND SURGERY         Home Medications    Prior to Admission medications   Medication Sig Start Date End Date Taking? Authorizing Provider  albuterol (PROVENTIL HFA;VENTOLIN HFA) 108 (90 Base) MCG/ACT inhaler Inhale 1-2 puffs into the lungs every 6 (six) hours as needed for wheezing or shortness of breath. 11/24/15   Konrad Felix, PA  albuterol (PROVENTIL HFA;VENTOLIN HFA) 108 (90 Base) MCG/ACT inhaler Inhale 2 puffs into the lungs every 4 (four) hours as needed for wheezing or shortness of breath. 11/24/15   Konrad Felix, PA  cetirizine-pseudoephedrine (ZYRTEC-D) 5-120 MG tablet Take 1 tablet by mouth daily. 07/28/17   Tasia Catchings, Makana Feigel V, PA-C  Codeine Polt-Chlorphen Polt ER (TUZISTRA XR) 14.7-2.8 MG/5ML SUER Take 10 mLs by mouth every 12 (twelve) hours as needed  (cough and congestion). 08/21/15   Shawnee Knapp, MD  fluticasone (FLONASE) 50 MCG/ACT nasal spray Place 2 sprays into both nostrils daily. 07/28/17   Tasia Catchings, Papa [place]field V, PA-C  ibuprofen (ADVIL,MOTRIN) 200 MG tablet Take 600 mg by mouth every 6 (six) hours as needed.    [provider]  ipratropium (ATROVENT) 0.06 % nasal spray Place 2 sprays into both nostrils 4 (four) times daily. 07/28/17   Tasia Catchings, Faysal Fenoglio V, PA-C  metFORMIN (GLUCOPHAGE) 1000 MG tablet 1,000 mg 2 (two) times daily with a meal.  12/28/14   [provider]  pantoprazole (PROTONIX) 40 MG tablet 40 mg daily.  12/10/14   [provider]    Family History Family History  Problem Relation Age of Onset  . Diabetes Mother   . Diabetes Father   . Cancer Father   . Diabetes Sister     Social History Social History   Tobacco Use  . Smoking status: Never Smoker  Substance Use Topics  . Alcohol use: Yes    Alcohol/week: 0.0 oz    Comment: rarely  . Drug use: No     Allergies   Patient has no known allergies.   Review of Systems Review of Systems  Reason unable to perform ROS: See HPI as above.     Physical Exam  Triage Vital Signs ED Triage Vitals [07/28/17 1950]  Enc Vitals Group     BP (!) 148/88     Pulse Rate 97     Resp 18     Temp 98.9 F (37.2 C)     Temp Source Oral     SpO2 98 %     Weight      Height      Head Circumference      Peak Flow      Pain Score      Pain Loc      Pain Edu?      Excl. in [place]?    No data found.  Updated Vital Signs BP (!) 148/88 (BP Location: Left Arm)   Pulse 97   Temp 98.9 F (37.2 C) (Oral)   Resp 18   SpO2 98%   Physical Exam  Constitutional: He is oriented to person, place, and time. He appears well-developed and well-nourished. No distress.  HENT:  Head: Normocephalic and atraumatic.  Right Ear: Tympanic membrane, external ear and ear canal normal. Tympanic membrane is not erythematous and not bulging. No middle ear effusion.  Left Ear: Tympanic  membrane, external ear and ear canal normal. Tympanic membrane is not erythematous and not bulging.  No middle ear effusion.  Nose: Nose normal. Right sinus exhibits no maxillary sinus tenderness and no frontal sinus tenderness. Left sinus exhibits no maxillary sinus tenderness and no frontal sinus tenderness.  Mouth/Throat: Uvula is midline, oropharynx is clear and moist and mucous membranes are normal.  Eyes: Conjunctivae are normal. Pupils are equal, round, and reactive to light.  Neck: Normal range of motion. Neck supple.  Cardiovascular: Normal rate, regular rhythm and normal heart sounds. Exam reveals no gallop and no friction rub.  No murmur heard. Pulmonary/Chest: Effort normal and breath sounds normal. He has no decreased breath sounds. He has no wheezes. He has no rhonchi. He has no rales.  Lymphadenopathy:    He has no cervical adenopathy.  Neurological: He is alert and oriented to person, place, and time.  Skin: Skin is warm and dry.  Psychiatric: He has a normal mood and affect. His behavior is normal. Judgment normal.    UC Treatments / Results  Labs (all labs ordered are listed, but only abnormal results are displayed) Labs Reviewed - No data to display  EKG  EKG Interpretation None       Radiology No results found.  Procedures Procedures (including critical care time)  Medications Ordered in UC Medications - No data to display   Initial Impression / Assessment and Plan / UC Course  I have reviewed the triage vital signs and the nursing notes.  Pertinent labs & imaging results that were available during my care of the patient were reviewed by me and considered in my medical decision making (see chart for details).    47 year old male comes in for a few week history of left ear pain.  Exam without cerumen impaction, otitis media, otitis externa.  No obvious middle ear effusion.  We will treat for possible eustachian tube dysfunction.  Start Flonase and  Atrovent nasal spray as directed.  Zyrtec-D as needed for further control of rhinorrhea.  Follow-up with PCP if symptoms not improving.  Patient expresses understanding and agrees to plan.  Final Clinical Impressions(s) / UC Diagnoses   Final diagnoses:  Left ear pain    ED Discharge Orders        Ordered    fluticasone (FLONASE)  50 MCG/ACT nasal spray  Daily     07/28/17 2044    ipratropium (ATROVENT) 0.06 % nasal spray  4 times daily     07/28/17 2044    cetirizine-pseudoephedrine (ZYRTEC-D) 5-120 MG tablet  Daily     07/28/17 2044       Ok Edwards, PA-C 07/29/17 1011

## 2017-07-28 NOTE — Discharge Instructions (Signed)
Start flonase, atrovent nasal spray, zyrtec-D for nasal congestion/drainage. You can use over the counter nasal saline rinse such as neti pot for nasal congestion. Keep hydrated, your urine should be clear to pale yellow in color. Tylenol/motrin for fever and pain. Follow up with PCP/ENT if symptoms not improving.

## 2017-08-26 DIAGNOSIS — H401111 Primary open-angle glaucoma, right eye, mild stage: Secondary | ICD-10-CM | POA: Diagnosis not present

## 2017-08-26 DIAGNOSIS — H401122 Primary open-angle glaucoma, left eye, moderate stage: Secondary | ICD-10-CM | POA: Diagnosis not present

## 2017-08-31 IMAGING — CT CT RENAL STONE PROTOCOL
2 of 3 series · 16 of 40 positions shown, 18 images · non-contrast
Comparison: 01/17/2015

CLINICAL DATA: Right flank pain radiating to the right groin and
scrotal area. Urinary urgency.

EXAM:
CT ABDOMEN AND PELVIS WITHOUT CONTRAST
TECHNIQUE: Multidetector CT imaging of the abdomen and pelvis was performed
following the standard protocol without IV contrast.

[Series 4: lung · axial · 0.85mm/px · z∈[+223,+338]mm · 13 of 26 slices shown, 15 images]
[im 2/26  soft-tissue]
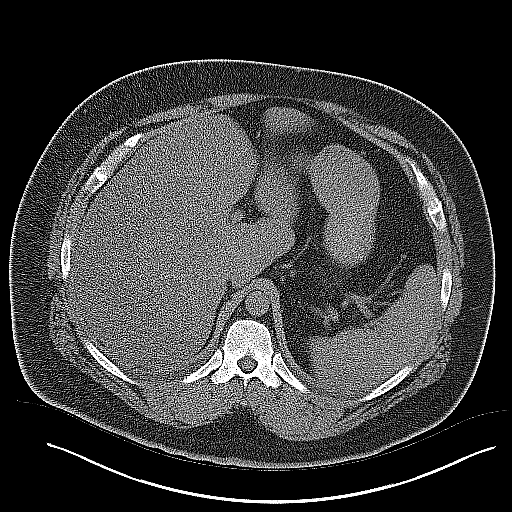
[im 2/26  bone]
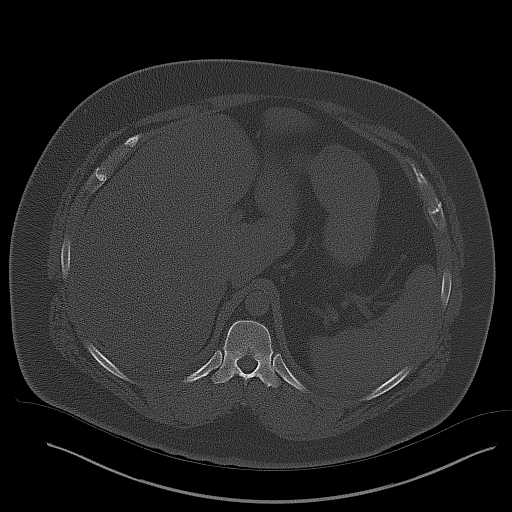
[im 4/26  soft-tissue]
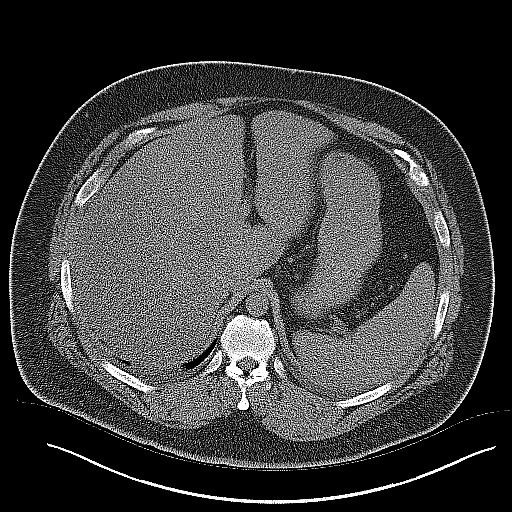
[im 6/26  soft-tissue]
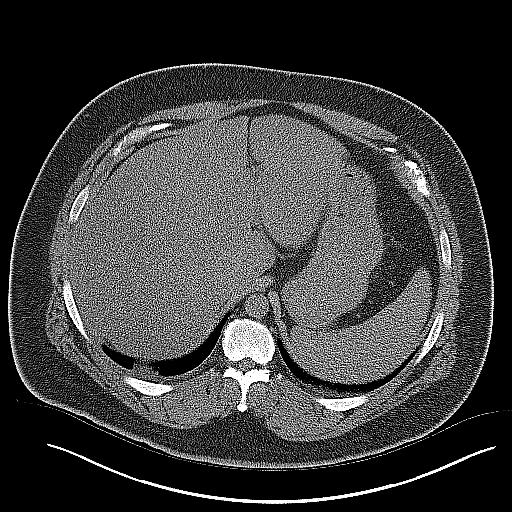
[im 8/26  soft-tissue]
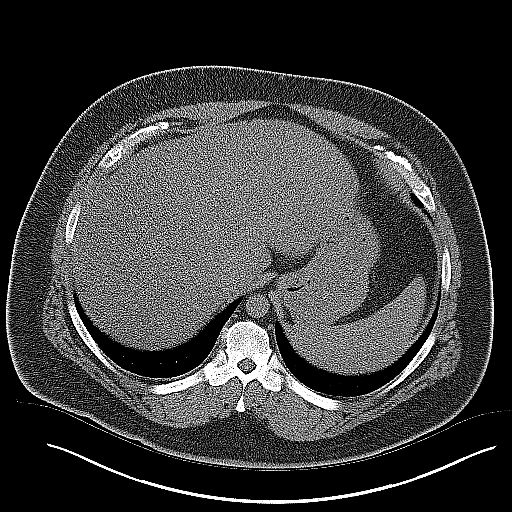
[im 10/26  soft-tissue]
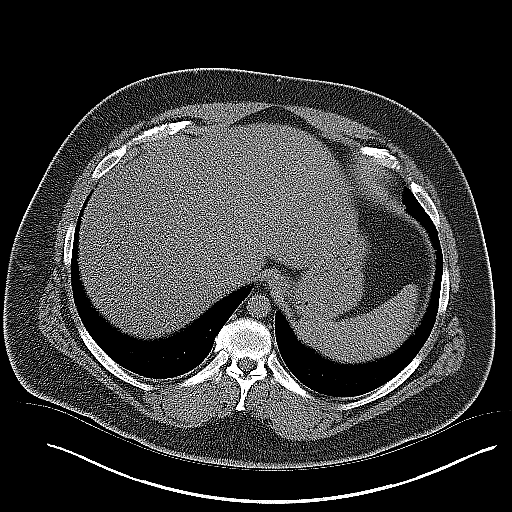
[im 12/26  soft-tissue]
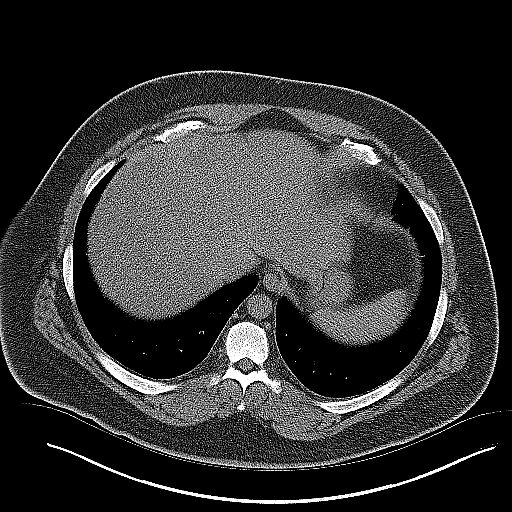
[im 14/26  soft-tissue]
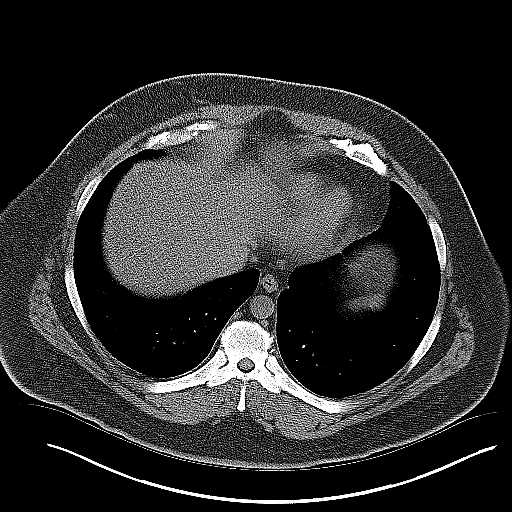
[im 15/26  soft-tissue]
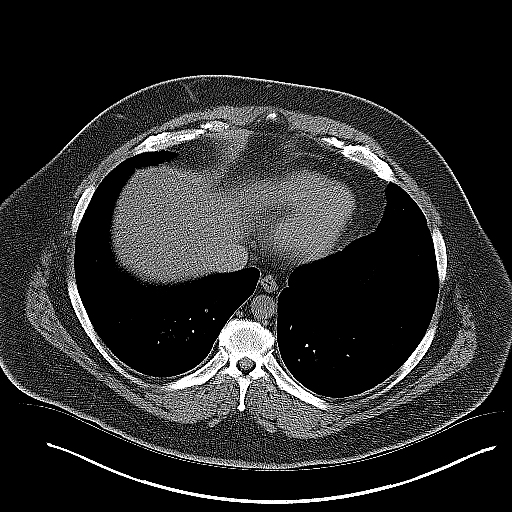
[im 17/26  soft-tissue]
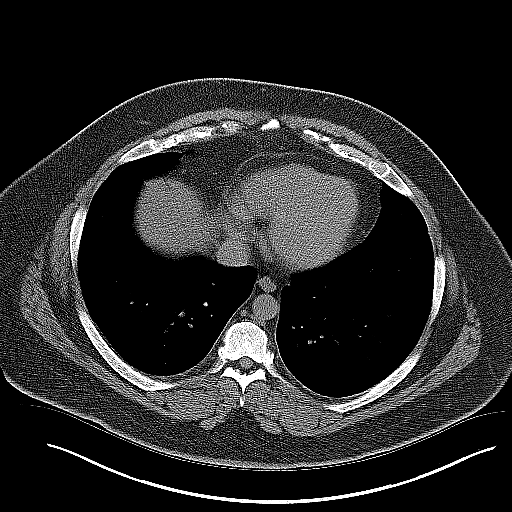
[im 17/26  bone]
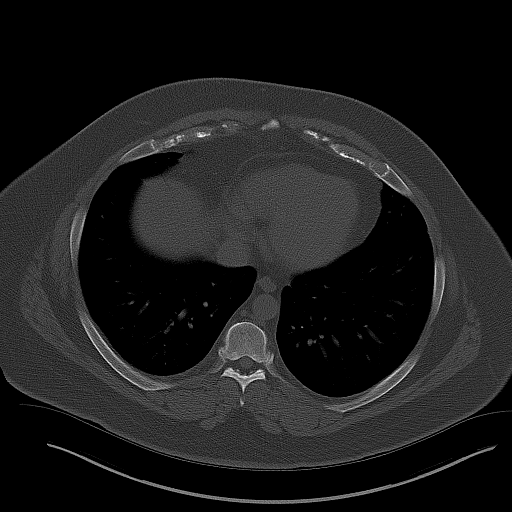
[im 19/26  soft-tissue]
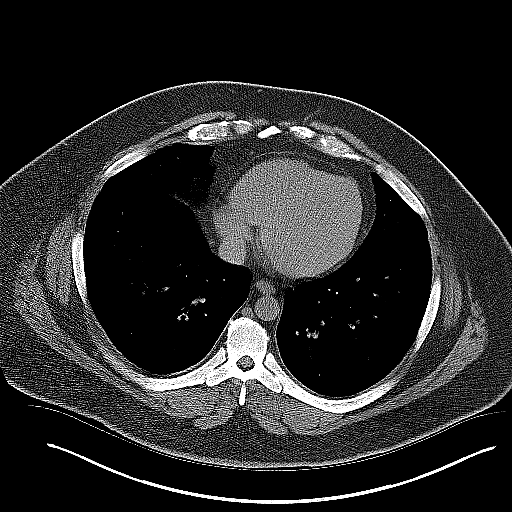
[im 21/26  soft-tissue]
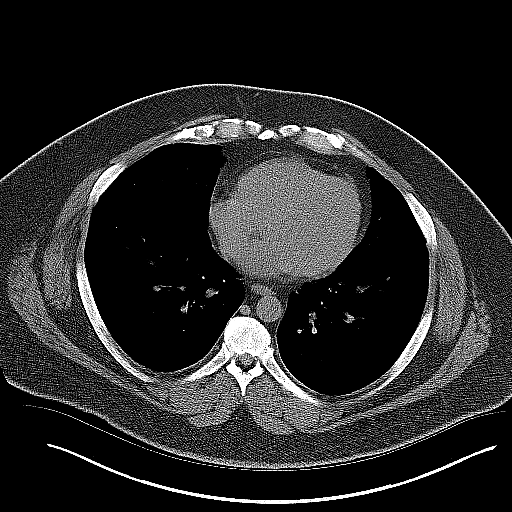
[im 23/26  soft-tissue]
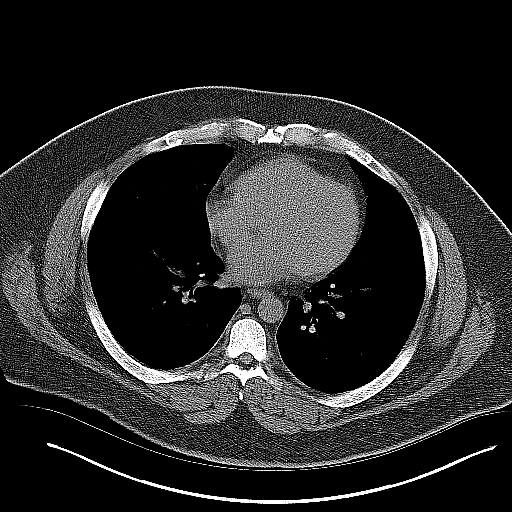
[im 25/26  soft-tissue]
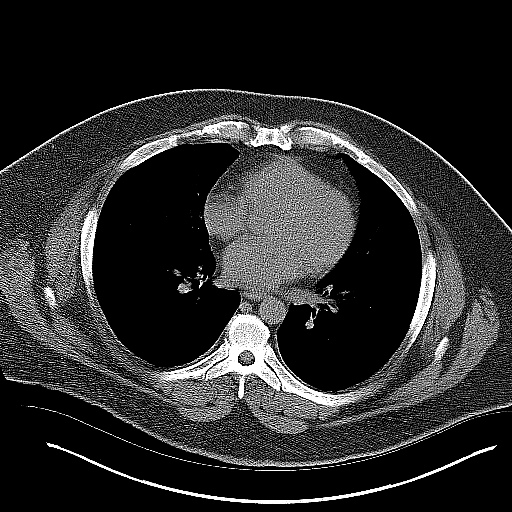

[Series 5: coronal · coronal · 0.82mm/px · 3 of 127 slices shown]
[im 43/127  soft-tissue]
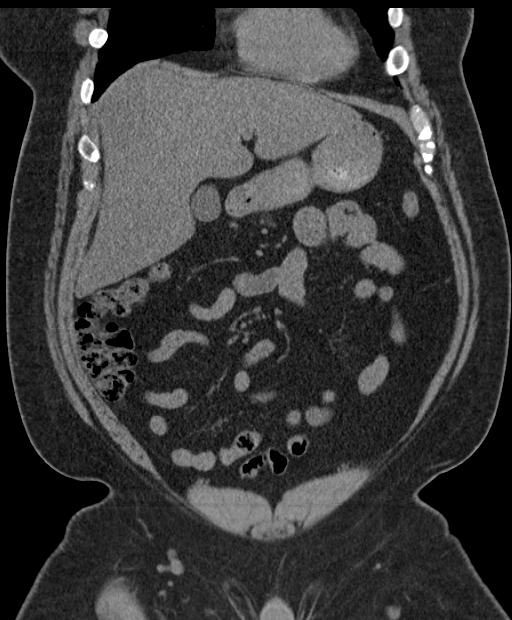
[im 57/127  soft-tissue]
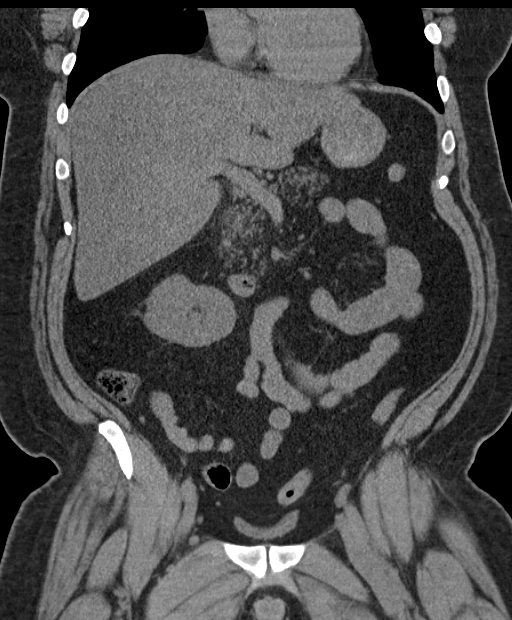
[im 71/127  soft-tissue]
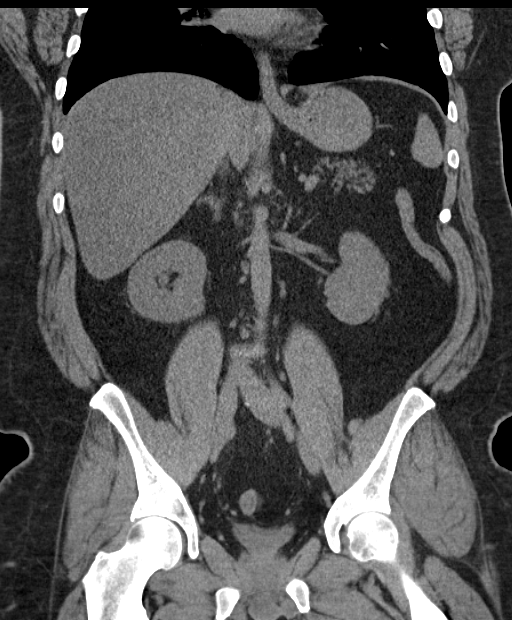

[16 of 40 positions shown; findings below may reference images not displayed]

FINDINGS: Lung bases are clear.

Multiple small intrarenal stones in the right kidney, largest
measuring about 2 mm diameter. Stone in the distal right ureter just
above the ureterovesical junction measuring about 2 mm diameter.
Mild stranding around the right ureter and right kidney. No
hydronephrosis or hydroureter. Left kidney and ureter appear normal.
Bladder is decompressed.

There is mild infiltration in the subcutaneous fat around the head
of the pancreas which could represent early changes of acute
pancreatitis. No pancreatic ductal dilatation. Mild fatty
infiltration of the pancreas. The unenhanced appearance of the
liver, spleen, gallbladder, adrenal glands, abdominal aorta,
inferior vena cava, and retroperitoneal lymph nodes is unremarkable.
Stomach, small bowel, and colon are decompressed. No free air or
free fluid in the abdomen. Abdominal wall musculature appears
intact.

Pelvis: The appendix is normal. Prostate gland is not enlarged. No
free or loculated pelvic fluid collections. No pelvic mass or
lymphadenopathy. Mild degenerative changes in the spine. No
destructive bone lesions.
IMPRESSION: 2 mm stone in the distal right ureter with minimal proximal
obstruction. Additional nonobstructing stones in the right kidney.
Vague infiltration in the fat around the head of the pancreas could
indicate early changes of focal pancreatitis.

## 2017-09-04 DIAGNOSIS — E119 Type 2 diabetes mellitus without complications: Secondary | ICD-10-CM | POA: Diagnosis not present

## 2017-09-04 DIAGNOSIS — R030 Elevated blood-pressure reading, without diagnosis of hypertension: Secondary | ICD-10-CM | POA: Diagnosis not present

## 2017-10-04 DIAGNOSIS — R8271 Bacteriuria: Secondary | ICD-10-CM | POA: Diagnosis not present

## 2017-10-04 DIAGNOSIS — R300 Dysuria: Secondary | ICD-10-CM | POA: Diagnosis not present

## 2017-10-07 DIAGNOSIS — R82998 Other abnormal findings in urine: Secondary | ICD-10-CM | POA: Diagnosis not present

## 2017-10-07 DIAGNOSIS — E119 Type 2 diabetes mellitus without complications: Secondary | ICD-10-CM | POA: Diagnosis not present

## 2017-10-07 DIAGNOSIS — Z125 Encounter for screening for malignant neoplasm of prostate: Secondary | ICD-10-CM | POA: Diagnosis not present

## 2017-10-07 DIAGNOSIS — E7849 Other hyperlipidemia: Secondary | ICD-10-CM | POA: Diagnosis not present

## 2017-10-07 DIAGNOSIS — Z0000 Encounter for general adult medical examination without abnormal findings: Secondary | ICD-10-CM | POA: Diagnosis not present

## 2017-10-17 DIAGNOSIS — E7849 Other hyperlipidemia: Secondary | ICD-10-CM | POA: Diagnosis not present

## 2017-10-17 DIAGNOSIS — R030 Elevated blood-pressure reading, without diagnosis of hypertension: Secondary | ICD-10-CM | POA: Diagnosis not present

## 2017-10-17 DIAGNOSIS — Z0000 Encounter for general adult medical examination without abnormal findings: Secondary | ICD-10-CM | POA: Diagnosis not present

## 2017-10-17 DIAGNOSIS — E1169 Type 2 diabetes mellitus with other specified complication: Secondary | ICD-10-CM | POA: Diagnosis not present

## 2017-12-04 DIAGNOSIS — R05 Cough: Secondary | ICD-10-CM | POA: Diagnosis not present

## 2017-12-04 DIAGNOSIS — J309 Allergic rhinitis, unspecified: Secondary | ICD-10-CM | POA: Diagnosis not present

## 2017-12-04 DIAGNOSIS — I10 Essential (primary) hypertension: Secondary | ICD-10-CM | POA: Diagnosis not present

## 2017-12-11 DIAGNOSIS — E1169 Type 2 diabetes mellitus with other specified complication: Secondary | ICD-10-CM | POA: Diagnosis not present

## 2017-12-11 DIAGNOSIS — I10 Essential (primary) hypertension: Secondary | ICD-10-CM | POA: Diagnosis not present

## 2018-06-04 DIAGNOSIS — E119 Type 2 diabetes mellitus without complications: Secondary | ICD-10-CM | POA: Diagnosis not present

## 2018-06-04 DIAGNOSIS — H401122 Primary open-angle glaucoma, left eye, moderate stage: Secondary | ICD-10-CM | POA: Diagnosis not present

## 2018-06-04 DIAGNOSIS — H401111 Primary open-angle glaucoma, right eye, mild stage: Secondary | ICD-10-CM | POA: Diagnosis not present

## 2018-06-04 DIAGNOSIS — H5213 Myopia, bilateral: Secondary | ICD-10-CM | POA: Diagnosis not present

## 2018-06-18 DIAGNOSIS — E7849 Other hyperlipidemia: Secondary | ICD-10-CM | POA: Diagnosis not present

## 2018-06-18 DIAGNOSIS — E1169 Type 2 diabetes mellitus with other specified complication: Secondary | ICD-10-CM | POA: Diagnosis not present

## 2018-06-18 DIAGNOSIS — I10 Essential (primary) hypertension: Secondary | ICD-10-CM | POA: Diagnosis not present

## 2018-07-18 DIAGNOSIS — N390 Urinary tract infection, site not specified: Secondary | ICD-10-CM | POA: Diagnosis not present

## 2018-07-18 DIAGNOSIS — R300 Dysuria: Secondary | ICD-10-CM | POA: Diagnosis not present

## 2018-07-18 DIAGNOSIS — R8290 Unspecified abnormal findings in urine: Secondary | ICD-10-CM | POA: Diagnosis not present

## 2019-06-03 ENCOUNTER — Ambulatory Visit: Payer: 59 | Attending: Internal Medicine

## 2019-06-03 DIAGNOSIS — Z20822 Contact with and (suspected) exposure to covid-19: Secondary | ICD-10-CM

## 2019-06-04 LAB — NOVEL CORONAVIRUS, NAA: SARS-CoV-2, NAA: NOT DETECTED

## 2021-05-04 ENCOUNTER — Other Ambulatory Visit (HOSPITAL_COMMUNITY): Payer: Self-pay | Admitting: Internal Medicine

## 2021-05-04 DIAGNOSIS — R109 Unspecified abdominal pain: Secondary | ICD-10-CM

## 2021-05-05 ENCOUNTER — Ambulatory Visit (HOSPITAL_COMMUNITY): Admission: RE | Admit: 2021-05-05 | Payer: 59 | Source: Ambulatory Visit

## 2022-06-29 ENCOUNTER — Other Ambulatory Visit: Payer: Self-pay

## 2022-06-29 ENCOUNTER — Emergency Department (HOSPITAL_COMMUNITY): Payer: 59

## 2022-06-29 ENCOUNTER — Emergency Department (HOSPITAL_COMMUNITY)
Admission: EM | Admit: 2022-06-29 | Discharge: 2022-06-29 | Disposition: A | Discharge status: Home / Self Care | Payer: 59 | Attending: Emergency Medicine | Admitting: Emergency Medicine

## 2022-06-29 DIAGNOSIS — R110 Nausea: Secondary | ICD-10-CM | POA: Insufficient documentation

## 2022-06-29 DIAGNOSIS — M549 Dorsalgia, unspecified: Secondary | ICD-10-CM | POA: Insufficient documentation

## 2022-06-29 DIAGNOSIS — R109 Unspecified abdominal pain: Secondary | ICD-10-CM

## 2022-06-29 DIAGNOSIS — N201 Calculus of ureter: Secondary | ICD-10-CM

## 2022-06-29 DIAGNOSIS — R350 Frequency of micturition: Secondary | ICD-10-CM | POA: Diagnosis not present

## 2022-06-29 DIAGNOSIS — R7989 Other specified abnormal findings of blood chemistry: Secondary | ICD-10-CM

## 2022-06-29 DIAGNOSIS — R1011 Right upper quadrant pain: Secondary | ICD-10-CM | POA: Insufficient documentation

## 2022-06-29 LAB — URINALYSIS, ROUTINE W REFLEX MICROSCOPIC
Bilirubin Urine: NEGATIVE
Glucose, UA: NEGATIVE mg/dL
Hgb urine dipstick: NEGATIVE
Ketones, ur: NEGATIVE mg/dL
Leukocytes,Ua: NEGATIVE
Nitrite: NEGATIVE
Protein, ur: NEGATIVE mg/dL
Specific Gravity, Urine: 1.017 (ref 1.005–1.030)
pH: 5 (ref 5.0–8.0)

## 2022-06-29 LAB — CBC WITH DIFFERENTIAL/PLATELET
Abs Immature Granulocytes: 0.06 10*3/uL (ref 0.00–0.07)
Basophils Absolute: 0.1 10*3/uL (ref 0.0–0.1)
Basophils Relative: 1 %
Eosinophils Absolute: 0.2 10*3/uL (ref 0.0–0.5)
Eosinophils Relative: 2 %
HCT: 40.5 % (ref 39.0–52.0)
Hemoglobin: 13 g/dL (ref 13.0–17.0)
Immature Granulocytes: 0 %
Lymphocytes Relative: 17 %
Lymphs Abs: 2.4 10*3/uL (ref 0.7–4.0)
MCH: 25.4 pg — ABNORMAL LOW (ref 26.0–34.0)
MCHC: 32.1 g/dL (ref 30.0–36.0)
MCV: 79.1 fL — ABNORMAL LOW (ref 80.0–100.0)
Monocytes Absolute: 1.1 10*3/uL — ABNORMAL HIGH (ref 0.1–1.0)
Monocytes Relative: 8 %
Neutro Abs: 10.5 10*3/uL — ABNORMAL HIGH (ref 1.7–7.7)
Neutrophils Relative %: 72 %
Platelets: 289 10*3/uL (ref 150–400)
RBC: 5.12 MIL/uL (ref 4.22–5.81)
RDW: 16.5 % — ABNORMAL HIGH (ref 11.5–15.5)
WBC: 14.4 10*3/uL — ABNORMAL HIGH (ref 4.0–10.5)
nRBC: 0 % (ref 0.0–0.2)

## 2022-06-29 LAB — COMPREHENSIVE METABOLIC PANEL
ALT: 11 U/L (ref 0–44)
AST: 21 U/L (ref 15–41)
Albumin: 4.2 g/dL (ref 3.5–5.0)
Alkaline Phosphatase: 47 U/L (ref 38–126)
Anion gap: 9 (ref 5–15)
BUN: 33 mg/dL — ABNORMAL HIGH (ref 6–20)
CO2: 22 mmol/L (ref 22–32)
Calcium: 8.9 mg/dL (ref 8.9–10.3)
Chloride: 102 mmol/L (ref 98–111)
Creatinine, Ser: 1.79 mg/dL — ABNORMAL HIGH (ref 0.61–1.24)
GFR, Estimated: 46 mL/min — ABNORMAL LOW (ref >60)
Glucose, Bld: 131 mg/dL — ABNORMAL HIGH (ref 70–99)
Potassium: 3.5 mmol/L (ref 3.5–5.1)
Sodium: 133 mmol/L — ABNORMAL LOW (ref 135–145)
Total Bilirubin: 0.6 mg/dL (ref 0.3–1.2)
Total Protein: 8.1 g/dL (ref 6.5–8.1)

## 2022-06-29 MED ORDER — TAMSULOSIN HCL 0.4 MG PO CAPS: 0.4000 mg | ORAL_CAPSULE | Freq: Two times a day (BID) | ORAL | 0 refills | Status: AC

## 2022-06-29 MED ORDER — OXYCODONE-ACETAMINOPHEN 5-325 MG PO TABS: 1.0000 | ORAL_TABLET | Freq: Four times a day (QID) | ORAL | 0 refills | Status: AC | PRN

## 2022-06-29 MED ORDER — SODIUM CHLORIDE 0.9 % IV BOLUS
1000.0000 mL | Freq: Once | INTRAVENOUS | Status: AC
  Administered 2022-06-29: 1000 mL via INTRAVENOUS

## 2022-06-29 MED ORDER — KETOROLAC TROMETHAMINE 15 MG/ML IJ SOLN
15.0000 mg | Freq: Once | INTRAMUSCULAR | Status: AC
Start: 2022-06-29 — End: 2022-06-29
  Administered 2022-06-29: 15 mg via INTRAVENOUS
  Filled 2022-06-29: qty 1

## 2022-06-29 MED ORDER — ONDANSETRON 4 MG PO TBDP: 4.0000 mg | ORAL_TABLET | Freq: Three times a day (TID) | ORAL | 0 refills | Status: DC | PRN

## 2022-06-29 MED ORDER — ONDANSETRON HCL 4 MG/2ML IJ SOLN
4.0000 mg | Freq: Once | INTRAMUSCULAR | Status: AC
  Administered 2022-06-29: 4 mg via INTRAVENOUS
  Filled 2022-06-29: qty 2

## 2022-06-29 NOTE — ED Provider Notes (Signed)
[facility] Provider Note   CSN: [id number] Arrival date & time: 06/29/22  2042     History  Chief Complaint  Patient presents with   Flank Pain    Benjamin Sharp is a 52 y.o. male, History of nephrolithiasis, reports no signs of looks history of nephrolithiasis, who presents to the ED secondary to right flank, right upper quadrant, right back pain for the last 2 days, with associated nausea, and feeling warm.  States that he has felt this way before, and is when he had a kidney stone.  Reports increased urinary frequency.  Denies any dysuria.    Home Medications Prior to Admission medications   Medication Sig Start Date End Date Taking? Authorizing Provider  albuterol (PROVENTIL HFA;VENTOLIN HFA) 108 (90 Base) MCG/ACT inhaler Inhale 1-2 puffs into the lungs every 6 (six) hours as needed for wheezing or shortness of breath. 11/24/15   Konrad Felix, PA  albuterol (PROVENTIL HFA;VENTOLIN HFA) 108 (90 Base) MCG/ACT inhaler Inhale 2 puffs into the lungs every 4 (four) hours as needed for wheezing or shortness of breath. 11/24/15   Konrad Felix, PA  cetirizine-pseudoephedrine (ZYRTEC-D) 5-120 MG tablet Take 1 tablet by mouth daily. 07/28/17   Tasia Catchings, Amy V, PA-C  Codeine Polt-Chlorphen Polt ER (TUZISTRA XR) 14.7-2.8 MG/5ML SUER Take 10 mLs by mouth every 12 (twelve) hours as needed (cough and congestion). 08/21/15   Shawnee Knapp, MD  fluticasone (FLONASE) 50 MCG/ACT nasal spray Place 2 sprays into both nostrils daily. 07/28/17   Tasia Catchings, Amy V, PA-C  ibuprofen (ADVIL,MOTRIN) 200 MG tablet Take 600 mg by mouth every 6 (six) hours as needed.    [provider]  ipratropium (ATROVENT) 0.06 % nasal spray Place 2 sprays into both nostrils 4 (four) times daily. 07/28/17   Tasia Catchings, Amy V, PA-C  metFORMIN (GLUCOPHAGE) 1000 MG tablet 1,000 mg 2 (two) times daily with a meal.  12/28/14   [provider]  pantoprazole (PROTONIX) 40 MG tablet 40 mg daily.  12/10/14    [provider]      Allergies    Patient has no known allergies.    Review of Systems   Review of Systems  Constitutional:  Negative for chills.  Genitourinary:  Positive for flank pain and frequency. Negative for dysuria.    Physical Exam Updated Vital Signs BP (!) 199/106 (BP Location: Right Arm)   Pulse 88   Temp 98.3 F (36.8 C) (Oral)   Resp 18   Ht 5' 7"$  (1.702 m)   Wt 106.6 kg   SpO2 99%   BMI 36.81 kg/m  Physical Exam Vitals and nursing note reviewed.  Constitutional:      General: He is not in acute distress.    Appearance: He is well-developed.  HENT:     Head: Normocephalic and atraumatic.  Eyes:     Conjunctiva/sclera: Conjunctivae normal.  Cardiovascular:     Rate and Rhythm: Normal rate and regular rhythm.     Heart sounds: No murmur heard. Pulmonary:     Effort: Pulmonary effort is normal. No respiratory distress.     Breath sounds: Normal breath sounds.  Abdominal:     Palpations: Abdomen is soft.     Tenderness: There is abdominal tenderness.     Comments: R flank and RUQ w/o guarding or CVA tenderness  Musculoskeletal:        General: No swelling.     Cervical back: Neck supple.  Skin:  General: Skin is warm and dry.     Capillary Refill: Capillary refill takes less than 2 seconds.  Neurological:     Mental Status: He is alert.  Psychiatric:        Mood and Affect: Mood normal.     ED Results / Procedures / Treatments   Labs (all labs ordered are listed, but only abnormal results are displayed) Labs Reviewed  URINALYSIS, ROUTINE W REFLEX MICROSCOPIC - Abnormal; Notable for the following components:      Result Value   Color, Urine STRAW (*)    All other components within normal limits  CBC WITH DIFFERENTIAL/PLATELET  COMPREHENSIVE METABOLIC PANEL    EKG None  Radiology No results found.  Procedures Procedures  Medications Ordered in ED Medications  ketorolac (TORADOL) 15 MG/ML injection 15 mg (has no  administration in time range)  sodium chloride 0.9 % bolus 1,000 mL (has no administration in time range)  ondansetron (ZOFRAN) injection 4 mg (has no administration in time range)    ED Course/ Medical Decision Making/ A&P                           Medical Decision Making Patient is a 52 year old male, here for urinary frequency, right flank pain, right upper quadrant pain has been going on for the last day and a half.  States it feels like his last kidney stone, we will give him Toradol, IV fluids, and obtain a CT renal as well as urinalysis and CBC and CMP.  Amount and/or Complexity of Data Reviewed Labs: ordered.    Details: Urinalysis unremarkable Radiology: ordered. Discussion of management or test interpretation with external provider(s): Transferred care to [facility], [place], pending CT renal study and reassessment for disposition.    Final Clinical Impression(s) / ED Diagnoses Final diagnoses:  Right flank pain    Rx / DC Orders ED Discharge Orders     None         Marquel Pottenger Carlean Jews, PA 06/29/22 2202    Lorelle Gibbs, DO 06/29/22 2325

## 2022-06-29 NOTE — Discharge Instructions (Addendum)
Please strain your urine with the provided urine strainer.    Take medications as directed.    Follow-up with the urologist.  You should have your kidney function rechecked by your doctor in 1-2 weeks.

## 2022-06-29 NOTE — ED Provider Notes (Signed)
CT notable for 66m stone.  Pain now well controlled.    Rx for : Meds ordered this encounter  Medications            oxyCODONE-acetaminophen (PERCOCET/ROXICET) 5-325 MG tablet    Sig: Take 1-2 tablets by mouth every 6 (six) hours as needed for severe pain.    Dispense:  15 tablet    Refill:  0    Order Specific Question:   Supervising Provider    Answer:   MILLER, BRIAN [[id number]]   ondansetron (ZOFRAN-ODT) 4 MG disintegrating tablet    Sig: Take 1 tablet (4 mg total) by mouth every 8 (eight) hours as needed for nausea or vomiting.    Dispense:  10 tablet    Refill:  0    Order Specific Question:   Supervising Provider    Answer:   MSabra Heck BRIAN [[id number]]   tamsulosin (FLOMAX) 0.4 MG CAPS capsule    Sig: Take 1 capsule (0.4 mg total) by mouth 2 (two) times daily.    Dispense:  10 capsule    Refill:  0    Order Specific Question:   Supervising Provider    Answer:   MNoemi Chapel[[phone]  Recommend urology follow-up.  Have kidney function rechecked in 1-2 weeks.   BMontine Circle PA-C 06/29/22 2253    HLorelle Gibbs DO 06/29/22 2326

## 2022-06-29 NOTE — ED Triage Notes (Signed)
Pt presents from home for R flank pain, nausea, feverish since yesterday. Has had kidney stone in the past and this feels similar. Last stone was 1 yr ago.  Pain is stabbing, intermittent  Has tried tylenol tonight.   Denies blood in urine

## 2022-07-01 ENCOUNTER — Emergency Department (HOSPITAL_COMMUNITY)
Admission: EM | Admit: 2022-07-01 | Discharge: 2022-07-01 | Disposition: A | Discharge status: Home / Self Care | Payer: 59 | Attending: Emergency Medicine | Admitting: Emergency Medicine

## 2022-07-01 DIAGNOSIS — R110 Nausea: Secondary | ICD-10-CM | POA: Insufficient documentation

## 2022-07-01 DIAGNOSIS — Z7984 Long term (current) use of oral hypoglycemic drugs: Secondary | ICD-10-CM | POA: Diagnosis not present

## 2022-07-01 DIAGNOSIS — R109 Unspecified abdominal pain: Secondary | ICD-10-CM | POA: Diagnosis present

## 2022-07-01 DIAGNOSIS — D72829 Elevated white blood cell count, unspecified: Secondary | ICD-10-CM | POA: Insufficient documentation

## 2022-07-01 DIAGNOSIS — N200 Calculus of kidney: Secondary | ICD-10-CM

## 2022-07-01 LAB — URINALYSIS, W/ REFLEX TO CULTURE (INFECTION SUSPECTED)
Bacteria, UA: NONE SEEN
Bilirubin Urine: NEGATIVE
Glucose, UA: NEGATIVE mg/dL
Ketones, ur: NEGATIVE mg/dL
Leukocytes,Ua: NEGATIVE
Nitrite: NEGATIVE
Protein, ur: NEGATIVE mg/dL
Specific Gravity, Urine: 1.013 (ref 1.005–1.030)
pH: 5 (ref 5.0–8.0)

## 2022-07-01 LAB — BASIC METABOLIC PANEL
Anion gap: 10 (ref 5–15)
BUN: 22 mg/dL — ABNORMAL HIGH (ref 6–20)
CO2: 25 mmol/L (ref 22–32)
Calcium: 9 mg/dL (ref 8.9–10.3)
Chloride: 101 mmol/L (ref 98–111)
Creatinine, Ser: 1.54 mg/dL — ABNORMAL HIGH (ref 0.61–1.24)
GFR, Estimated: 55 mL/min — ABNORMAL LOW (ref >60)
Glucose, Bld: 183 mg/dL — ABNORMAL HIGH (ref 70–99)
Potassium: 3.9 mmol/L (ref 3.5–5.1)
Sodium: 136 mmol/L (ref 135–145)

## 2022-07-01 LAB — CBC WITH DIFFERENTIAL/PLATELET
Abs Immature Granulocytes: 0.05 10*3/uL (ref 0.00–0.07)
Basophils Absolute: 0 10*3/uL (ref 0.0–0.1)
Basophils Relative: 0 %
Eosinophils Absolute: 0.1 10*3/uL (ref 0.0–0.5)
Eosinophils Relative: 1 %
HCT: 41 % (ref 39.0–52.0)
Hemoglobin: 13.1 g/dL (ref 13.0–17.0)
Immature Granulocytes: 0 %
Lymphocytes Relative: 18 %
Lymphs Abs: 2.1 10*3/uL (ref 0.7–4.0)
MCH: 25.4 pg — ABNORMAL LOW (ref 26.0–34.0)
MCHC: 32 g/dL (ref 30.0–36.0)
MCV: 79.5 fL — ABNORMAL LOW (ref 80.0–100.0)
Monocytes Absolute: 1 10*3/uL (ref 0.1–1.0)
Monocytes Relative: 9 %
Neutro Abs: 8.1 10*3/uL — ABNORMAL HIGH (ref 1.7–7.7)
Neutrophils Relative %: 72 %
Platelets: 307 10*3/uL (ref 150–400)
RBC: 5.16 MIL/uL (ref 4.22–5.81)
RDW: 16.2 % — ABNORMAL HIGH (ref 11.5–15.5)
WBC: 11.4 10*3/uL — ABNORMAL HIGH (ref 4.0–10.5)
nRBC: 0 % (ref 0.0–0.2)

## 2022-07-01 MED ORDER — KETOROLAC TROMETHAMINE 10 MG PO TABS: 10.0000 mg | ORAL_TABLET | Freq: Four times a day (QID) | ORAL | 0 refills | Status: AC | PRN

## 2022-07-01 MED ORDER — ONDANSETRON HCL 4 MG/2ML IJ SOLN
4.0000 mg | Freq: Once | INTRAMUSCULAR | Status: AC
  Administered 2022-07-01: 4 mg via INTRAVENOUS
  Filled 2022-07-01: qty 2

## 2022-07-01 MED ORDER — SODIUM CHLORIDE 0.9 % IV BOLUS
1000.0000 mL | Freq: Once | INTRAVENOUS | Status: AC
  Administered 2022-07-01: 1000 mL via INTRAVENOUS

## 2022-07-01 MED ORDER — ONDANSETRON 8 MG PO TBDP: 8.0000 mg | ORAL_TABLET | Freq: Three times a day (TID) | ORAL | 0 refills | Status: AC | PRN

## 2022-07-01 MED ORDER — KETOROLAC TROMETHAMINE 15 MG/ML IJ SOLN
15.0000 mg | Freq: Once | INTRAMUSCULAR | Status: AC
  Administered 2022-07-01: 15 mg via INTRAVENOUS
  Filled 2022-07-01: qty 1

## 2022-07-01 NOTE — ED Triage Notes (Signed)
Pt presents to ED from home C/O continued R flank pain. Seen 2 days ago, dx with R kidney stone, given pain and nausea meds. Reports pain meds are no longer working. Denies difficulty with urination.

## 2022-07-01 NOTE — ED Provider Notes (Signed)
[place] [facility]MENT AT [facility] Provider Note   CSN: [id number] Arrival date & time: 07/01/22  1003     History  Chief Complaint  Patient presents with   Flank Pain    Benjamin Sharp is a 52 y.o. male.   Flank Pain     This is a 89 male presented to the emergency department due to right flank pain.  He was seen in the emergency department 2 days ago, diagnosed with 3 mm nephrolithiasis, he is taking the Flomax and pain medicine at home but despite that he is nauseated and having severe pain.  He is drinking fluids but unable to eat anything secondary to nausea, denies vomiting.  When he was seen previously had a new AKI with creatinine 1.7, denies any fevers or chills.  Home Medications Prior to Admission medications   Medication Sig Start Date End Date Taking? Authorizing Provider  albuterol (PROVENTIL HFA;VENTOLIN HFA) 108 (90 Base) MCG/ACT inhaler Inhale 1-2 puffs into the lungs every 6 (six) hours as needed for wheezing or shortness of breath. 11/24/15   Konrad Felix, PA  albuterol (PROVENTIL HFA;VENTOLIN HFA) 108 (90 Base) MCG/ACT inhaler Inhale 2 puffs into the lungs every 4 (four) hours as needed for wheezing or shortness of breath. 11/24/15   Konrad Felix, PA  cetirizine-pseudoephedrine (ZYRTEC-D) 5-120 MG tablet Take 1 tablet by mouth daily. 07/28/17   Tasia Catchings, Amy V, PA-C  Codeine Polt-Chlorphen Polt ER (TUZISTRA XR) 14.7-2.8 MG/5ML SUER Take 10 mLs by mouth every 12 (twelve) hours as needed (cough and congestion). 08/21/15   Shawnee Knapp, MD  fluticasone (FLONASE) 50 MCG/ACT nasal spray Place 2 sprays into both nostrils daily. 07/28/17   Tasia Catchings, Amy V, PA-C  ibuprofen (ADVIL,MOTRIN) 200 MG tablet Take 600 mg by mouth every 6 (six) hours as needed.    [provider]  ipratropium (ATROVENT) 0.06 % nasal spray Place 2 sprays into both nostrils 4 (four) times daily. 07/28/17   Tasia Catchings, Amy V, PA-C  metFORMIN (GLUCOPHAGE) 1000 MG tablet 1,000 mg 2 (two) times  daily with a meal.  12/28/14   [provider]  ondansetron (ZOFRAN-ODT) 4 MG disintegrating tablet Take 1 tablet (4 mg total) by mouth every 8 (eight) hours as needed for nausea or vomiting. 06/29/22   Montine Circle, PA-C  oxyCODONE-acetaminophen (PERCOCET/ROXICET) 5-325 MG tablet Take 1-2 tablets by mouth every 6 (six) hours as needed for severe pain. 06/29/22   Montine Circle, PA-C  pantoprazole (PROTONIX) 40 MG tablet 40 mg daily.  12/10/14   [provider]  tamsulosin (FLOMAX) 0.4 MG CAPS capsule Take 1 capsule (0.4 mg total) by mouth 2 (two) times daily. 06/29/22   Montine Circle, PA-C      Allergies    Patient has no known allergies.    Review of Systems   Review of Systems  Genitourinary:  Positive for flank pain.    Physical Exam Updated Vital Signs BP 139/78   Pulse 79   Temp 98.9 F (37.2 C) (Oral)   Resp 18   Ht 5' 7"$  (1.702 m)   Wt 106.6 kg   SpO2 100%   BMI 36.81 kg/m  Physical Exam Vitals and nursing note reviewed. Exam conducted with a chaperone present.  Constitutional:      Appearance: Normal appearance.  HENT:     Head: Normocephalic and atraumatic.  Eyes:     General: No scleral icterus.       Right eye: No discharge.  Left eye: No discharge.     Extraocular Movements: Extraocular movements intact.     Pupils: Pupils are equal, round, and reactive to light.  Cardiovascular:     Rate and Rhythm: Normal rate and regular rhythm.     Pulses: Normal pulses.     Heart sounds: Normal heart sounds. No murmur heard.    No friction rub. No gallop.  Pulmonary:     Effort: Pulmonary effort is normal. No respiratory distress.     Breath sounds: Normal breath sounds.  Abdominal:     General: Abdomen is flat. Bowel sounds are normal. There is no distension.     Palpations: Abdomen is soft.     Tenderness: There is no abdominal tenderness. There is right CVA tenderness.  Skin:    General: Skin is warm and dry.     Coloration: Skin is not  jaundiced.  Neurological:     Mental Status: He is alert. Mental status is at baseline.     Coordination: Coordination normal.     ED Results / Procedures / Treatments   Labs (all labs ordered are listed, but only abnormal results are displayed) Labs Reviewed  CBC WITH DIFFERENTIAL/PLATELET - Abnormal; Notable for the following components:      Result Value   WBC 11.4 (*)    MCV 79.5 (*)    MCH 25.4 (*)    RDW 16.2 (*)    Neutro Abs 8.1 (*)    All other components within normal limits  BASIC METABOLIC PANEL - Abnormal; Notable for the following components:   Glucose, Bld 183 (*)    BUN 22 (*)    Creatinine, Ser 1.54 (*)    GFR, Estimated 55 (*)    All other components within normal limits  URINALYSIS, W/ REFLEX TO CULTURE (INFECTION SUSPECTED)    EKG None  Radiology CT Renal Stone Study  Result Date: 06/29/2022 CLINICAL DATA:  Right flank pain EXAM: CT ABDOMEN AND PELVIS WITHOUT CONTRAST TECHNIQUE: Multidetector CT imaging of the abdomen and pelvis was performed following the standard protocol without IV contrast. RADIATION DOSE REDUCTION: This exam was performed according to the departmental dose-optimization program which includes automated exposure control, adjustment of the mA and/or kV according to patient size and/or use of iterative reconstruction technique. COMPARISON:  CT renal stone 05/01/2015 FINDINGS: Lower chest: Are trace bilateral pleural effusions. Hepatobiliary: No focal liver abnormality is seen. No gallstones, gallbladder wall thickening, or biliary dilatation. Pancreas: Unremarkable. No pancreatic ductal dilatation or surrounding inflammatory changes. Spleen: Normal in size without focal abnormality. Adrenals/Urinary Tract: There is a 3 mm calculus in the proximal right ureter. There is mild right-sided hydronephrosis with perinephric fat stranding. No other urinary tract calculi are seen. The adrenal glands, left kidney, and bladder are within normal limits.  Stomach/Bowel: Stomach is within normal limits. Appendix appears normal. No evidence of bowel wall thickening, distention, or inflammatory changes. Vascular/Lymphatic: Aortic atherosclerosis. No enlarged abdominal or pelvic lymph nodes. Reproductive: Prostate is unremarkable. Other: No abdominal wall hernia or abnormality. No abdominopelvic ascites. Musculoskeletal: No acute or significant osseous findings. IMPRESSION: 1. 3 mm calculus in the proximal right ureter with mild obstructive uropathy. 2. Trace bilateral pleural effusions. Aortic Atherosclerosis (ICD10-I70.0). Electronically Signed   By: Ronney Asters M.D.   On: 06/29/2022 22:22    Procedures Procedures    Medications Ordered in ED Medications  sodium chloride 0.9 % bolus 1,000 mL (0 mLs Intravenous Stopped 07/01/22 1120)  ondansetron (ZOFRAN) injection 4 mg (4 mg  Intravenous Given 07/01/22 1024)  ketorolac (TORADOL) 15 MG/ML injection 15 mg (15 mg Intravenous Given 07/01/22 1025)    ED Course/ Medical Decision Making/ A&P                             Medical Decision Making Amount and/or Complexity of Data Reviewed Labs: ordered.  Risk Prescription drug management.   This is a 52 year old male with history of nephrolithiasis recently presenting to the emergency department due to continued right flank pain.  Differential includes obstructive uropathy, sepsis, AKI.  I reviewed external medical records including previous imaging studies and most recent labs obtained 2 days ago.  Will repeat laboratory workup to recheck kidney function as he had an AKI when he was seen a few days ago.  Clinically, patient does not meet SIRS criteria and does not appear septic.  I recommended interpreted laboratory workup as documented ED course.  Improved renal function with a creatinine 1.5, still elevated above baseline but improving.  He has a mild leukocytosis which think is most likely secondary to pain, will add on a UA and culture/.    I  considered admission for pain control given but pain has improved after Toradol and fluids I think is reasonable to discharge patient and add on Toradol to his current pain regiment.  Discussed taking with food and water as to not worsen his renal function, discussed return precautions.  Patient discharged stable condition.        Final Clinical Impression(s) / ED Diagnoses Final diagnoses:  None    Rx / DC Orders ED Discharge Orders     None         Sherrill Raring, PA-C 07/01/22 [place], MD 07/04/22 1526

## 2022-07-01 NOTE — Discharge Instructions (Addendum)
You are seen today in the emergency department for kidney stone.  Take the home pain medicine you have been prescribed, add on Toradol every 6 hours as needed for pain take it with food and water.  Do not take this at the same time as ibuprofen or any anti-inflammatory medicine as they work and similar pathways will worsen renal function together.  Return to the ED for fever, intractable nausea and vomiting, severe pain.  Otherwise follow-up with urology for reevaluation in a week or so.

## 2023-06-06 ENCOUNTER — Other Ambulatory Visit: Payer: Self-pay | Admitting: Urology

## 2023-06-06 DIAGNOSIS — R972 Elevated prostate specific antigen [PSA]: Secondary | ICD-10-CM

## 2023-06-21 ENCOUNTER — Ambulatory Visit
Admission: RE | Admit: 2023-06-21 | Discharge: 2023-06-21 | Disposition: A | Discharge status: Home / Self Care | Payer: 59 | Source: Ambulatory Visit | Attending: Urology

## 2023-06-21 DIAGNOSIS — R972 Elevated prostate specific antigen [PSA]: Secondary | ICD-10-CM

## 2023-06-21 MED ORDER — GADOPICLENOL 0.5 MMOL/ML IV SOLN
10.0000 mL | Freq: Once | INTRAVENOUS | Status: AC | PRN
  Administered 2023-06-21: 10 mL via INTRAVENOUS
